# Patient Record
Sex: Female | Born: 1935 | Race: White | Hispanic: No | Smoking: Never smoker
Health system: Southern US, Community
[De-identification: ages and names within clinical notes are randomized; demographics above are authoritative.]

## PROBLEM LIST (undated history)

## (undated) DIAGNOSIS — I10 Essential (primary) hypertension: Secondary | ICD-10-CM

## (undated) DIAGNOSIS — F0390 Unspecified dementia without behavioral disturbance: Secondary | ICD-10-CM

## (undated) DIAGNOSIS — E785 Hyperlipidemia, unspecified: Secondary | ICD-10-CM

## (undated) DIAGNOSIS — H3530 Unspecified macular degeneration: Secondary | ICD-10-CM

## (undated) DIAGNOSIS — K5792 Diverticulitis of intestine, part unspecified, without perforation or abscess without bleeding: Secondary | ICD-10-CM

## (undated) DIAGNOSIS — M1990 Unspecified osteoarthritis, unspecified site: Secondary | ICD-10-CM

## (undated) HISTORY — PX: APPENDECTOMY: SHX54

## (undated) HISTORY — DX: Unspecified osteoarthritis, unspecified site: M19.90

## (undated) HISTORY — DX: Unspecified macular degeneration: H35.30

## (undated) HISTORY — DX: Hyperlipidemia, unspecified: E78.5

## (undated) HISTORY — PX: TONSILLECTOMY AND ADENOIDECTOMY: SHX28

## (undated) HISTORY — DX: Essential (primary) hypertension: I10

## (undated) HISTORY — DX: Diverticulitis of intestine, part unspecified, without perforation or abscess without bleeding: K57.92

---
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
## 2006-10-07 ENCOUNTER — Ambulatory Visit: Payer: Self-pay | Admitting: Internal Medicine

## 2006-10-11 ENCOUNTER — Ambulatory Visit: Payer: Self-pay | Admitting: Internal Medicine

## 2006-10-11 ENCOUNTER — Ambulatory Visit (HOSPITAL_COMMUNITY): Admission: RE | Admit: 2006-10-11 | Discharge: 2006-10-11 | Payer: Self-pay | Admitting: Internal Medicine

## 2006-10-11 LAB — HM COLONOSCOPY

## 2010-08-10 ENCOUNTER — Emergency Department (HOSPITAL_COMMUNITY): Admission: EM | Admit: 2010-08-10 | Discharge: 2010-08-10 | Payer: Self-pay | Admitting: Emergency Medicine

## 2010-08-11 ENCOUNTER — Ambulatory Visit: Payer: Self-pay | Admitting: Orthopedic Surgery

## 2010-08-11 DIAGNOSIS — S92309A Fracture of unspecified metatarsal bone(s), unspecified foot, initial encounter for closed fracture: Secondary | ICD-10-CM | POA: Insufficient documentation

## 2010-09-24 ENCOUNTER — Ambulatory Visit
Admission: RE | Admit: 2010-09-24 | Discharge: 2010-09-24 | Payer: Self-pay | Source: Home / Self Care | Attending: Orthopedic Surgery | Admitting: Orthopedic Surgery

## 2010-09-27 ENCOUNTER — Encounter: Payer: Self-pay | Admitting: Orthopedic Surgery

## 2010-10-13 NOTE — Letter (Signed)
Summary: History form  History form   Imported By: Jacklynn Ganong 08/11/2010 15:40:20  _____________________________________________________________________  External Attachment:    Type:   Image     Comment:   External Document

## 2010-10-13 NOTE — Assessment & Plan Note (Signed)
Summary: [facility] FX 5TH METAR/XR THERE/[facility]/BSF   Vital Signs:  Patient profile:   77 year old female Height:      64 inches Weight:      160 pounds Pulse rate:   80 / minute Resp:     16 per minute  Vitals Entered By: Fuller Canada MD (August 11, 2010 10:47 AM)  Visit Type:  new patient Referring Provider:  [facility] Primary Provider:  Dr. [name]  CC:  right foot fracture.  History of Present Illness: I saw Lynn Mcintosh in the office today for an initial visit.  She is a 77 years old woman with the complaint of:  right foot fracture.  DOI 08/09/10.  Xrays [facility] 08/10/10.  Meds: Lisinopril, no pain med.  This 77 year old female complains of mild to moderate RIGHT foot pain associated with some swelling and bruising of the RIGHT foot and difficulty with weightbearing.  She was placed in a posterior splint after x-rays show an avulsion fracture at the proximal fifth metatarsal bone she comes in for her first evaluation     Allergies (verified): No Known Drug Allergies  Past History:  Past Medical History: htn diverticulitis OA Macular degeneration  Past Surgical History: appendix tonsils  Family History: FH of Cancer:  Family History of Arthritis  Social History: Patient is married.  retired no smoking no alcohol coffee use daily 13 yr education  Review of Systems Constitutional:  Denies weight loss, weight gain, fever, chills, and fatigue. Cardiovascular:  Denies chest pain, palpitations, fainting, and murmurs. Respiratory:  Denies short of breath, wheezing, couch, tightness, pain on inspiration, and snoring . Gastrointestinal:  Denies heartburn, nausea, vomiting, diarrhea, constipation, and blood in your stools. Genitourinary:  Denies frequency, urgency, difficulty urinating, painful urination, flank pain, and bleeding in urine. Neurologic:  Denies numbness, tingling, unsteady gait, dizziness, tremors, and seizure. Musculoskeletal:  Complains of muscle  pain; denies joint pain, swelling, instability, stiffness, redness, and heat. Endocrine:  Denies excessive thirst, exessive urination, and heat or cold intolerance. Psychiatric:  Denies nervousness, depression, anxiety, and hallucinations. Skin:  Denies changes in the skin, poor healing, rash, itching, and redness. HEENT:  Denies blurred or double vision, eye pain, redness, and watering. Immunology:  Denies seasonal allergies, sinus problems, and allergic to bee stings. Hemoatologic:  Denies easy bleeding and brusing.  Physical Exam  Psych:  alert and cooperative; normal mood and affect; normal attention span and concentration   Foot/Ankle Exam  General:    Well-developed, well-nourished ,normal body habitus; no deformities, normal grooming.    Gait:    antalgic.    Skin:    redness.    Inspection:    ecchymosis: and swelling:RIGHT foot  Palpation:    proximal fifth metatarsal RIGHT foot  Vascular:    dorsalis pedis and posterior tibial pulses 2+ and symmetric, capillary refill < 2 seconds, normal hair pattern, no evidence of ischemia.   Sensory:    gross sensation intact bilaterally in lower extremities.    Motor:    Motor strength 5/5 bilaterally for ankle dorsiflexion, ankle plantar flexion, ankle inversion and ankle eversion.    Reflexes:    .deferred    Ankle Exam:    Right:    Inspection/Palpation:  normal range of motion  Foot Exam:    Right:    Inspection:  Abnormal    Palpation:  Abnormal   Impression & Recommendations:  Problem # 1:  CLOSED FRACTURE OF METATARSAL BONE (ICD-825.25) Assessment New  hospital films avulsion  fracture proximal fifth metatarsal  Orders: New Patient Level III (16109)  Patient Instructions: 1)  6 weeks wear post op shoe  2)  return for xrays    Orders Added: 1)  New Patient Level III [60454]

## 2010-10-15 NOTE — Assessment & Plan Note (Signed)
Summary: 6 wk RE-CK XRAYS RT FOOT(5th METATARSAL)FX CARE/MEDICARE/CAF   Visit Type:  Follow-up Referring Provider:  [facility] Primary Provider:  Dr. [name]  CC:  right foot fracture.  History of Present Illness: I saw Lynn Mcintosh in the office today for a followup visit.  She is a 77 years old woman with the complaint of:  right foot fracture  DOI 08/09/10.  Xrays [facility] 08/10/10.  Meds: Lisinopril, no pain med.  Today, xrays and recheck.  Complaints: none  Exam fracture is non tender   Separate and Identifiable X-Ray report      3 views right foot / dx fracture   prox 5th MTT frcature avulsion has healed in normal alignment   IMPR: healed fracture 5th MMT   A/P ok to d/c and wear reg shoes    Allergies: No Known Drug Allergies   Impression & Recommendations:  Problem # 1:  CLOSED FRACTURE OF METATARSAL BONE (ICD-825.25) Assessment Improved  Orders: Post-Op Check (40981) Foot x-ray complete, minimum 3 views (19147)  Patient Instructions: 1)  Please schedule a follow-up appointment as needed.   Orders Added: 1)  Post-Op Check [99024] 2)  Foot x-ray complete, minimum 3 views [73630]

## 2010-10-28 ENCOUNTER — Encounter: Payer: Self-pay | Admitting: Orthopedic Surgery

## 2010-10-28 ENCOUNTER — Ambulatory Visit (INDEPENDENT_AMBULATORY_CARE_PROVIDER_SITE_OTHER): Payer: Medicare Other | Admitting: Orthopedic Surgery

## 2010-10-28 DIAGNOSIS — S93409A Sprain of unspecified ligament of unspecified ankle, initial encounter: Secondary | ICD-10-CM

## 2010-10-28 DIAGNOSIS — S8263XA Displaced fracture of lateral malleolus of unspecified fibula, initial encounter for closed fracture: Secondary | ICD-10-CM | POA: Insufficient documentation

## 2010-11-04 NOTE — Assessment & Plan Note (Signed)
Summary: new problem left foot injury needs xr/aarp/bsf   Visit Type:  new problem Referring Provider:  [facility] Primary Provider:  Dr. Janna Mcintosh  CC:  left ankle pain .  History of Present Illness: I saw Lynn Mcintosh in the office today for an initial visit.  She is a 77 years old woman with the complaint of: left ankle pain   Meds: Lisinopril, no pain med.  DOI 10-24-10. Fall  c/o lateral ankle pain and pain with weight bearing; swelling, ecchymosis,   Xrays today.  Allergies: No Known Drug Allergies  Review of Systems Constitutional:  Denies weight loss, weight gain, fever, chills, and fatigue. Cardiovascular:  Denies chest pain, palpitations, fainting, and murmurs. Respiratory:  Denies short of breath, wheezing, couch, tightness, pain on inspiration, and snoring . Gastrointestinal:  Denies heartburn, nausea, vomiting, diarrhea, constipation, and blood in your stools. Genitourinary:  Denies frequency, urgency, difficulty urinating, painful urination, flank pain, and bleeding in urine. Neurologic:  Denies numbness, tingling, unsteady gait, dizziness, tremors, and seizure. Musculoskeletal:  Denies joint pain, swelling, instability, stiffness, redness, heat, and muscle pain. Endocrine:  Denies excessive thirst, exessive urination, and heat or cold intolerance. Psychiatric:  Denies nervousness, depression, anxiety, and hallucinations. Skin:  Denies changes in the skin, poor healing, rash, itching, and redness. HEENT:  Denies blurred or double vision, eye pain, redness, and watering. Immunology:  Denies seasonal allergies, sinus problems, and allergic to bee stings. Hemoatologic:  Denies easy bleeding and brusing.  Physical Exam  Additional Exam:  General appearance is normal.  she is oriented x3.  Her mood and affect are normal.  She is ambulatory with, a he'll bearing date  Tenderness and swelling, distal fibula and syndesmosis. Range of motion zero-20, LEFT ankle. LEFT  ankle stable. Plantarflexion strength is 5 over 5.  Skin his bruise and ecchymosis over the syndesmosis. Pulse and temperature are normal. Sensation normal. Balance is good.     Impression & Recommendations:  Problem # 1:  CLOSED FRACTURE OF LATERAL MALLEOLUS (ICD-824.2) Assessment New  Separate and Identifiable X-Ray report       3 views, ankle, LEFT lower extremity  The distal fibula nondisplaced fracture, syndesmosis intact mortise intact.  Impression distal fibula fracture  Orders: Est. Patient Level IV (16109) Ankle x-ray complete,  minimum 3 views (60454)  Patient Instructions: 1)  wear brace for walking  2)  recheck ankle in 1 month with left ankle xrays    Orders Added: 1)  Est. Patient Level IV [09811] 2)  Ankle x-ray complete,  minimum 3 views [91478]

## 2010-11-12 ENCOUNTER — Encounter: Payer: Self-pay | Admitting: Orthopedic Surgery

## 2010-11-30 ENCOUNTER — Encounter: Payer: Self-pay | Admitting: Orthopedic Surgery

## 2010-12-01 ENCOUNTER — Ambulatory Visit (INDEPENDENT_AMBULATORY_CARE_PROVIDER_SITE_OTHER): Payer: Medicare Other | Admitting: Orthopedic Surgery

## 2010-12-01 DIAGNOSIS — S8263XA Displaced fracture of lateral malleolus of unspecified fibula, initial encounter for closed fracture: Secondary | ICD-10-CM

## 2010-12-01 NOTE — Discharge Summary (Signed)
3 views, LEFT ankle. for LEFT ankle fracture, lateral malleolus.  The previously noted lateral malleolar fracture Weber type A, has healed.  Mortise is intact.  Impression healed LEFT ankle fracture

## 2010-12-01 NOTE — Patient Instructions (Signed)
Activities as tolerated follow as needed

## 2010-12-01 NOTE — Progress Notes (Signed)
   LEFT ankle exam.  Patient is ambulating without any assistive device. There is no tenderness at the fracture site. She has normal ankle range of motion.

## 2010-12-10 NOTE — Letter (Signed)
Summary: History form  History form   Imported By: Cammie Sickle 11/29/2010 12:24:47  _____________________________________________________________________  External Attachment:    Type:   Image     Comment:   External Document

## 2011-01-29 NOTE — Consult Note (Signed)
NAMEMarland Kitchen  TWANNA, RESH NO.:  [id number]   MEDICAL RECORD NO.:  [id number]           PATIENT TYPE:   LOCATION:                                 FACILITY:   PHYSICIAN:  Lionel December, M.D.    DATE OF BIRTH:  Jun 05, 1921   DATE OF CONSULTATION:  DATE OF DISCHARGE:                                 CONSULTATION   REQUESTING PHYSICIAN:  Melvyn Novas, MD   REASON FOR CONSULTATION:  Time for colonoscopy, constipation.   HISTORY OF PRESENT ILLNESS:  Ms. Messamore is an 77 year old lady who  presents for worsening of constipation.  She states that she is overdue  for a colonoscopy.  Last colonoscopy was in September, 2001, at which  time she had a left colonic diverticulosis and a tiny polyp at the  splenic flexure which was hyperplastic.  She has a son who was diagnosed  with colon cancer at age 67 and is nine years out with multiple  complications.  Because of this, she is on a five-year follow-up  schedule.  She has had chronic constipation in the last 1-2 months.  The  symptoms have been worsened.  She states that it may be due to combined  calcium and pravastatin that she has been on.  She never has a bowel  movement now unless she takes a laxative.  She has been taking Senalax  every 3-4 days with good results.  She denies any melena or rectal  bleeding.  She really has no abdominal pain, nausea, vomiting, or  heartburn.  Denies any weight loss.   CURRENT MEDICATIONS:  1. Fosamax 70 mg weekly.  2. Micardis 40 mg daily.  3. Naproxen 500 mg b.i.d.  4. Fish oil tablets t.i.d.  5. Icap t.i.d.  6. Senalax every 3-4 days.  7. Pravastatin 40 mg daily.   ALLERGIES:  No known drug allergies.   PAST MEDICAL HISTORY:  Osteoporosis, osteoarthritis,  hypercholesterolemia, hypertension.   PAST SURGICAL HISTORY:  Appendectomy, tonsillectomy, right breast biopsy  in 1980, which was benign.   FAMILY HISTORY:  She had a son diagnosed with colon cancer at age 52,  who underwent seven surgeries, radiation and chemotherapy.   SOCIAL HISTORY:  She has been married for 65 years, retired Product/process development scientist, nonsmoker.  No alcohol use.   REVIEW OF SYSTEMS:  See HPI for GI.  CARDIOPULMONARY:  No chest pain or  shortness of breath.  See HPI for constitutional.   PHYSICAL EXAMINATION:  VITAL SIGNS:  Weight 188.  Height 5 feet 2.  Temp  97.4, blood pressure 126/82, pulse 84.  GENERAL:  A pleasant elderly Caucasian female in no acute distress.  SKIN:  Warm and dry.  No jaundice.  HEENT:  Sclerae are anicteric.  Oropharyngeal mucosa is moist and pink.  No lesions, erythema, or exudate.  NECK:  No lymphadenopathy.  CHEST:  Lungs are clear to auscultation.  CARDIAC:  Regular rate and rhythm.  A normal S1 and S2.  No murmurs,  rubs or gallops.  ABDOMEN:  Positive bowel sounds.  Abdomen is soft, nontender  and  nondistended.  No organomegaly or masses.  EXTREMITIES:  No edema.   IMPRESSION:  Ms. Hasten is an 77 year old lady with worsening  constipation, possibly related to medications.  Her last colonoscopy was  in September, 2001.  She has a family history of colorectal cancer in a  son.  She is overdue for surveillance study at this time.   PLAN:  1. Colonoscopy with Dr. Karilyn Cota in the near future.  2. MiraLax 17 gm daily, two weeks of samples provided.  3. Further recommendations to follow.      Tana Coast, P.A.      Lionel December, M.D.  Electronically Signed    LL/MEDQ  D:  10/07/2006  T:  10/07/2006  Job:  [id number]   cc:   Melvyn Novas, MD  Fax: [phone]

## 2011-01-29 NOTE — Op Note (Signed)
Lynn Mcintosh                 ACCOUNT NO.:  [id number]   MEDICAL RECORD NO.:  [id number]          PATIENT TYPE:  AMB   LOCATION:  DAY                           FACILITY:  [facility]   PHYSICIAN:  Lionel December, M.D.    DATE OF BIRTH:  1920-11-28   DATE OF PROCEDURE:  10/11/2006  DATE OF DISCHARGE:                               OPERATIVE REPORT   PROCEDURE:  Colonoscopy.   INDICATIONS:  Lynn Mcintosh is an 77 year old Caucasian female who is undergoing  colonoscopy both for diagnostic and screening purposes.  She has chronic  constipation which has gotten worse lately.  Her last exam was in  September 2001, with nonedematous polyp.  Family history is significant  colon for colon carcinoma,  son who had surgery at age 24.  The  procedure and risks were reviewed the patient. Informed consent was  obtained.   MEDICATIONS FOR CONSCIOUS SEDATION:  Demerol 15 mg IV, Versed 3 mg IV.   FINDINGS:  Procedure performed in endoscopy suite.  The patient's vital  signs and O2 saturation were monitored during the procedure and remained  stable.  The patient was placed in the left lateral recumbent position  and rectal examination performed.  No abnormality noted on external or  digital exam.  Pentax videoscope was placed rectum and advanced under  vision into sigmoid colon and beyond.  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  She had multiple diverticula in the  sigmoid colon, and some of which were quite large.  The scope was  carefully minimal maneuvered in this segment and passed into the  descending colon, where she had a few more scattered diverticula.  Preparation was satisfactory.  Scope was advanced into cecum which was  identified by the ileocecal valve and appendiceal orifice.  She had to  be turned on the right side in order to see behind the valve.  As the  scope was withdrawn, colonic mucosa was once again carefully examined,  and there were no polyps and/or tumor  masses.  Similarly, no stricture  was noted in the sigmoid colon.  Rectal mucosa was normal.  Scope was  retroflexed to examine anorectal junction, and small hemorrhoids were  noted below the dentate line.  Endoscope was straightened and withdrawn.  The patient tolerated the procedure well.   FINAL DIAGNOSIS:  1. Left colonic diverticulosis.  She has a few diverticula at the      descending colon but multiple at sigmoid colon.  2. Next small external hemorrhoids.   RECOMMENDATIONS:  She will continue a high-fiber diet and fiber  supplement 3-4 g daily, and continue MiraLax 17 g daily.  She can buy it  over the counter.   The patient also was informed of potential side effects with naproxen,  and she just needs to be careful.      Lionel December, M.D.  Electronically Signed     NR/MEDQ  D:  10/11/2006  T:  10/11/2006  Job:  [id number]   cc:   Melvyn Novas, MD  Fax: [phone]

## 2013-11-08 ENCOUNTER — Emergency Department (HOSPITAL_COMMUNITY): Payer: Medicare Other

## 2013-11-08 ENCOUNTER — Emergency Department (HOSPITAL_COMMUNITY)
Admission: EM | Admit: 2013-11-08 | Discharge: 2013-11-08 | Disposition: A | Discharge status: Home / Self Care | Payer: Medicare Other | Attending: Emergency Medicine | Admitting: Emergency Medicine

## 2013-11-08 ENCOUNTER — Encounter (HOSPITAL_COMMUNITY): Payer: Self-pay | Admitting: Emergency Medicine

## 2013-11-08 DIAGNOSIS — S1093XA Contusion of unspecified part of neck, initial encounter: Secondary | ICD-10-CM

## 2013-11-08 DIAGNOSIS — Z8719 Personal history of other diseases of the digestive system: Secondary | ICD-10-CM | POA: Insufficient documentation

## 2013-11-08 DIAGNOSIS — S93409A Sprain of unspecified ligament of unspecified ankle, initial encounter: Secondary | ICD-10-CM | POA: Insufficient documentation

## 2013-11-08 DIAGNOSIS — S93401A Sprain of unspecified ligament of right ankle, initial encounter: Secondary | ICD-10-CM

## 2013-11-08 DIAGNOSIS — X500XXA Overexertion from strenuous movement or load, initial encounter: Secondary | ICD-10-CM | POA: Insufficient documentation

## 2013-11-08 DIAGNOSIS — S0003XA Contusion of scalp, initial encounter: Secondary | ICD-10-CM | POA: Insufficient documentation

## 2013-11-08 DIAGNOSIS — Y9301 Activity, walking, marching and hiking: Secondary | ICD-10-CM | POA: Insufficient documentation

## 2013-11-08 DIAGNOSIS — Z79899 Other long term (current) drug therapy: Secondary | ICD-10-CM | POA: Insufficient documentation

## 2013-11-08 DIAGNOSIS — Z8669 Personal history of other diseases of the nervous system and sense organs: Secondary | ICD-10-CM | POA: Insufficient documentation

## 2013-11-08 DIAGNOSIS — Y929 Unspecified place or not applicable: Secondary | ICD-10-CM | POA: Insufficient documentation

## 2013-11-08 DIAGNOSIS — IMO0002 Reserved for concepts with insufficient information to code with codable children: Secondary | ICD-10-CM | POA: Insufficient documentation

## 2013-11-08 DIAGNOSIS — Z8739 Personal history of other diseases of the musculoskeletal system and connective tissue: Secondary | ICD-10-CM | POA: Insufficient documentation

## 2013-11-08 DIAGNOSIS — W07XXXA Fall from chair, initial encounter: Secondary | ICD-10-CM | POA: Insufficient documentation

## 2013-11-08 DIAGNOSIS — S0100XA Unspecified open wound of scalp, initial encounter: Secondary | ICD-10-CM | POA: Insufficient documentation

## 2013-11-08 DIAGNOSIS — W19XXXA Unspecified fall, initial encounter: Secondary | ICD-10-CM

## 2013-11-08 DIAGNOSIS — S0990XA Unspecified injury of head, initial encounter: Secondary | ICD-10-CM

## 2013-11-08 DIAGNOSIS — I10 Essential (primary) hypertension: Secondary | ICD-10-CM | POA: Insufficient documentation

## 2013-11-08 DIAGNOSIS — S0083XA Contusion of other part of head, initial encounter: Secondary | ICD-10-CM

## 2013-11-08 NOTE — ED Notes (Signed)
Pt presents with small laceration to forehead as well as right ankle swelling and pain. Pt states her ankle rolled over while she was walking causing her to fall. Pt denies LOC. A&Ox4. Bleeding controlled pta.Pt's last tetanus shot was over 5 years ago.

## 2013-11-08 NOTE — ED Notes (Signed)
Ice applied to forehead.

## 2013-11-08 NOTE — ED Provider Notes (Signed)
CSN: [id number]     Arrival date & time 11/08/13  1727 History  This chart was scribed for Lynn Hutching, MD by Ardelia Mems, ED Scribe. This patient was seen in room [facility]03/[facility]03 and the patient's care was started at 7:56 PM.   Chief Complaint  Patient presents with  . Fall    The history is provided by the patient. No language interpreter was used.    HPI Comments: Lynn Mcintosh is a 80 y.o. female who presents to the Emergency Department complaining of a fall that occurred earlier tonight. Pt states that she was getting out of a chair, and that her foot was asleep, causing her to fall. She states that she rolled her right ankle at the time of the fall, and she is complaining of constant, moderate right ankle pain with associated swelling onset after the fall. She is also complaining of bruising to her forehead and a small laceration to her forehead, as well as mild neck pain described as "soreness". She denies LOC pertaining to the fall. She denies hip pain, or any other pain or symptoms.   Past Medical History  Diagnosis Date  . OA (osteoarthritis)   . Hypertension   . Diverticulitis   . Macular degeneration    Past Surgical History  Procedure Laterality Date  . Appendectomy    . Tonsillectomy and adenoidectomy     Family History  Problem Relation Age of Onset  . Cancer      family history   . Arthritis      family history    History  Substance Use Topics  . Smoking status: Never Smoker   . Smokeless tobacco: Not on file  . Alcohol Use: No   OB History   Grav Para Term Preterm Abortions TAB SAB Ect Mult Living                 Review of Systems  Musculoskeletal: Positive for arthralgias (right ankle), joint swelling (right ankle) and neck pain.  Skin: Positive for wound (forehead laceration).  Neurological: Negative for syncope.  All other systems reviewed and are negative.   Allergies  Review of patient's allergies indicates no known allergies.  Home Medications    Current Outpatient Rx  Name  Route  Sig  Dispense  Refill  . LISINOPRIL PO   Oral   Take 1 tablet by mouth daily.          . Psyllium (METAMUCIL PO)   Oral   Take 1 tablet by mouth 2 (two) times daily.          Triage Vitals: BP 106/80  Pulse 93  Temp(Src) 97.9 F (36.6 C) (Oral)  Resp 18  SpO2 100%  Physical Exam  Nursing note and vitals reviewed. Constitutional: She is oriented to person, place, and time. She appears well-developed and well-nourished.  HENT:  Head: Normocephalic.  Central and lower forehead ecchymosis. 1 cm eliptical laceration on right forehead.  Eyes: Conjunctivae and EOM are normal. Pupils are equal, round, and reactive to light.  Neck: Normal range of motion. Neck supple.  Cardiovascular: Normal rate, regular rhythm and normal heart sounds.   Pulmonary/Chest: Effort normal and breath sounds normal.  Abdominal: Soft. Bowel sounds are normal.  Musculoskeletal: Normal range of motion. She exhibits tenderness.  Mild tenderness in upper thoracic spine. Tenderness in lateral aspect of right ankle.  Neurological: She is alert and oriented to person, place, and time.  Skin: Skin is warm and dry.  Psychiatric:  She has a normal mood and affect. Her behavior is normal.    ED Course  Procedures (including critical care time)  DIAGNOSTIC STUDIES: Oxygen Saturation is 100% on RA, normal by my interpretation.    COORDINATION OF CARE: 8:01 PM- Discussed plan to obtain diagnostic radiology of pt's head, neck and right ankle/foot. Will also apply ice to pt's forehead. Discussed that sutures/staples are not needed for pt's small forehead laceration. Pt advised of plan for treatment and pt agrees.  Labs Review Labs Reviewed - No data to display Imaging Review Dg Ankle Complete Right  11/08/2013   CLINICAL DATA:  Injury right ankle 11/08/2013.  EXAM: RIGHT ANKLE - COMPLETE 3+ VIEW  COMPARISON:  Plain films the right foot 08/10/2010.  FINDINGS: No acute bony  or joint abnormality is identified. Plantar calcaneal spur is noted. Atherosclerotic vascular disease is seen.  IMPRESSION: No acute finding.   Electronically Signed   By: Drusilla Kannerhomas  Dalessio M.D.   On: 11/08/2013 20:51   Ct Head Wo Contrast  11/08/2013   CLINICAL DATA:  Fall, abrasion with forehead swelling. Headache and neck pain.  EXAM: CT HEAD WITHOUT CONTRAST  CT MAXILLOFACIAL WITHOUT CONTRAST  CT CERVICAL SPINE WITHOUT CONTRAST  TECHNIQUE: Multidetector CT imaging of the head, cervical spine, and maxillofacial structures were performed using the standard protocol without intravenous contrast. Multiplanar CT image reconstructions of the cervical spine and maxillofacial structures were also generated.  COMPARISON:  None available for comparison at time of study interpretation.  FINDINGS: CT HEAD FINDINGS  The ventricles and sulci are normal for age. No intraparenchymal hemorrhage, mass effect nor midline shift. Patchy supratentorial white matter hypodensities are within normal range for patient's age and though non-specific suggest sequelae of chronic small vessel ischemic disease. No acute large vascular territory infarcts.  No abnormal extra-axial fluid collections. Basal cisterns are patent. Moderate calcific atherosclerosis of the carotid siphons.  No skull fracture. Small right frontal scalp defect may reflect laceration without radiopaque foreign bodies. Ocular globes and orbital contents are nonsuspicious, status post bilateral ocular lens implants.  CT MAXILLOFACIAL FINDINGS  No facial fracture. The mandible is intact in the condyles are located.Severe left temporomandibular osteoarthrosis, moderate on the right. Air-fluid level in the left maxillary sinus associated with bony wall thickening consistent with chronic sinusitis. Nasal septum slightly deviated to the right. No destructive bony lesions.  Ocular globes and orbital contents are nonsuspicious, status post bilateral ocular lens implants. Torus  palatini. Left submandibular 10 mm sialolith with atrophic left submandibular gland.  CT CERVICAL SPINE FINDINGS  Cervical vertebral bodies and posterior elements appear intact and aligned with maintenance of the cervical lordosis. Severe C4-5, C5-6 degenerative disc disease, moderate to severe at C6-7. C1-2 articulation maintained with moderate arthropathy and calcified apical ligament. No destructive bony lesions. Coarse calcification of the carotid bifurcations. Left submandibular 10 mm sialolith with atrophic left submandibular gland. Paraspinal soft tissues are unremarkable.  Degenerative disc disease and facet arthropathy result in mild canal stenosis C3-4, mild to moderate C4-5, moderate C5-6. Severe left C3-4, bilateral C4-5, C5-6 neural foraminal narrowing.  IMPRESSION: CT head: Suspected small right frontal scalp laceration without underlying skull fracture and no acute intracranial process.  Involutional changes. Moderate white matter changes suggest chronic small vessel ischemic disease.  Maxillofacial CT: No facial fracture. Acute on chronic left maxillary sinusitis.  CT cervical spine: No acute cervical spine fracture nor malalignment.  Left submandibular gland atrophy associated with 10 mm sialolith, no CT findings of acute sialoadenitis.  Electronically Signed   By: Awilda Metro   On: 11/08/2013 20:57   Ct Cervical Spine Wo Contrast  11/08/2013   CLINICAL DATA:  Fall, abrasion with forehead swelling. Headache and neck pain.  EXAM: CT HEAD WITHOUT CONTRAST  CT MAXILLOFACIAL WITHOUT CONTRAST  CT CERVICAL SPINE WITHOUT CONTRAST  TECHNIQUE: Multidetector CT imaging of the head, cervical spine, and maxillofacial structures were performed using the standard protocol without intravenous contrast. Multiplanar CT image reconstructions of the cervical spine and maxillofacial structures were also generated.  COMPARISON:  None available for comparison at time of study interpretation.  FINDINGS: CT HEAD  FINDINGS  The ventricles and sulci are normal for age. No intraparenchymal hemorrhage, mass effect nor midline shift. Patchy supratentorial white matter hypodensities are within normal range for patient's age and though non-specific suggest sequelae of chronic small vessel ischemic disease. No acute large vascular territory infarcts.  No abnormal extra-axial fluid collections. Basal cisterns are patent. Moderate calcific atherosclerosis of the carotid siphons.  No skull fracture. Small right frontal scalp defect may reflect laceration without radiopaque foreign bodies. Ocular globes and orbital contents are nonsuspicious, status post bilateral ocular lens implants.  CT MAXILLOFACIAL FINDINGS  No facial fracture. The mandible is intact in the condyles are located.Severe left temporomandibular osteoarthrosis, moderate on the right. Air-fluid level in the left maxillary sinus associated with bony wall thickening consistent with chronic sinusitis. Nasal septum slightly deviated to the right. No destructive bony lesions.  Ocular globes and orbital contents are nonsuspicious, status post bilateral ocular lens implants. Torus palatini. Left submandibular 10 mm sialolith with atrophic left submandibular gland.  CT CERVICAL SPINE FINDINGS  Cervical vertebral bodies and posterior elements appear intact and aligned with maintenance of the cervical lordosis. Severe C4-5, C5-6 degenerative disc disease, moderate to severe at C6-7. C1-2 articulation maintained with moderate arthropathy and calcified apical ligament. No destructive bony lesions. Coarse calcification of the carotid bifurcations. Left submandibular 10 mm sialolith with atrophic left submandibular gland. Paraspinal soft tissues are unremarkable.  Degenerative disc disease and facet arthropathy result in mild canal stenosis C3-4, mild to moderate C4-5, moderate C5-6. Severe left C3-4, bilateral C4-5, C5-6 neural foraminal narrowing.  IMPRESSION: CT head: Suspected  small right frontal scalp laceration without underlying skull fracture and no acute intracranial process.  Involutional changes. Moderate white matter changes suggest chronic small vessel ischemic disease.  Maxillofacial CT: No facial fracture. Acute on chronic left maxillary sinusitis.  CT cervical spine: No acute cervical spine fracture nor malalignment.  Left submandibular gland atrophy associated with 10 mm sialolith, no CT findings of acute sialoadenitis.   Electronically Signed   By: Awilda Metro   On: 11/08/2013 20:57   Dg Foot Complete Right  11/08/2013   CLINICAL DATA:  Injury, right foot pain.  EXAM: RIGHT FOOT COMPLETE - 3+ VIEW  COMPARISON:  Plain films right foot 08/10/2010.  FINDINGS: No acute bony or joint abnormality is identified. Advanced first MTP osteoarthritis is seen. Fracture at the base of the fifth metatarsal seen on the comparison study has healed.  IMPRESSION: No acute finding.  Advanced first MTP osteoarthritis.   Electronically Signed   By: Drusilla Kanner M.D.   On: 11/08/2013 20:51   Ct Maxillofacial Wo Cm  11/08/2013   CLINICAL DATA:  Fall, abrasion with forehead swelling. Headache and neck pain.  EXAM: CT HEAD WITHOUT CONTRAST  CT MAXILLOFACIAL WITHOUT CONTRAST  CT CERVICAL SPINE WITHOUT CONTRAST  TECHNIQUE: Multidetector CT imaging of the head, cervical spine, and maxillofacial  structures were performed using the standard protocol without intravenous contrast. Multiplanar CT image reconstructions of the cervical spine and maxillofacial structures were also generated.  COMPARISON:  None available for comparison at time of study interpretation.  FINDINGS: CT HEAD FINDINGS  The ventricles and sulci are normal for age. No intraparenchymal hemorrhage, mass effect nor midline shift. Patchy supratentorial white matter hypodensities are within normal range for patient's age and though non-specific suggest sequelae of chronic small vessel ischemic disease. No acute large vascular  territory infarcts.  No abnormal extra-axial fluid collections. Basal cisterns are patent. Moderate calcific atherosclerosis of the carotid siphons.  No skull fracture. Small right frontal scalp defect may reflect laceration without radiopaque foreign bodies. Ocular globes and orbital contents are nonsuspicious, status post bilateral ocular lens implants.  CT MAXILLOFACIAL FINDINGS  No facial fracture. The mandible is intact in the condyles are located.Severe left temporomandibular osteoarthrosis, moderate on the right. Air-fluid level in the left maxillary sinus associated with bony wall thickening consistent with chronic sinusitis. Nasal septum slightly deviated to the right. No destructive bony lesions.  Ocular globes and orbital contents are nonsuspicious, status post bilateral ocular lens implants. Torus palatini. Left submandibular 10 mm sialolith with atrophic left submandibular gland.  CT CERVICAL SPINE FINDINGS  Cervical vertebral bodies and posterior elements appear intact and aligned with maintenance of the cervical lordosis. Severe C4-5, C5-6 degenerative disc disease, moderate to severe at C6-7. C1-2 articulation maintained with moderate arthropathy and calcified apical ligament. No destructive bony lesions. Coarse calcification of the carotid bifurcations. Left submandibular 10 mm sialolith with atrophic left submandibular gland. Paraspinal soft tissues are unremarkable.  Degenerative disc disease and facet arthropathy result in mild canal stenosis C3-4, mild to moderate C4-5, moderate C5-6. Severe left C3-4, bilateral C4-5, C5-6 neural foraminal narrowing.  IMPRESSION: CT head: Suspected small right frontal scalp laceration without underlying skull fracture and no acute intracranial process.  Involutional changes. Moderate white matter changes suggest chronic small vessel ischemic disease.  Maxillofacial CT: No facial fracture. Acute on chronic left maxillary sinusitis.  CT cervical spine: No acute  cervical spine fracture nor malalignment.  Left submandibular gland atrophy associated with 10 mm sialolith, no CT findings of acute sialoadenitis.   Electronically Signed   By: Awilda Metro   On: 11/08/2013 20:57    EKG Interpretation   None       MDM   Final diagnoses:  Fall  Head injury  Right ankle sprain    Patient is neurologically intact. CT scan of head, face, cervical spine all negative. Additionally plain films of right ankle and foot were negative. Ankle brace, ice, Tylenol.   I personally performed the services described in this documentation, which was scribed in my presence. The recorded information has been reviewed and is accurate.    Lynn Hutching, MD 11/08/13 2156

## 2013-11-08 NOTE — Discharge Instructions (Signed)
All x-rays were normal. Ankle brace.  Ice.   Tylenol for pain

## 2015-10-24 ENCOUNTER — Ambulatory Visit: Payer: Self-pay | Admitting: Internal Medicine

## 2015-11-24 ENCOUNTER — Encounter: Payer: Self-pay | Admitting: Internal Medicine

## 2015-12-19 ENCOUNTER — Ambulatory Visit: Payer: Self-pay | Admitting: Internal Medicine

## 2016-04-16 ENCOUNTER — Other Ambulatory Visit: Payer: Self-pay | Admitting: Internal Medicine

## 2016-04-16 ENCOUNTER — Ambulatory Visit (INDEPENDENT_AMBULATORY_CARE_PROVIDER_SITE_OTHER): Payer: Medicare Other | Admitting: Internal Medicine

## 2016-04-16 ENCOUNTER — Encounter: Payer: Self-pay | Admitting: Internal Medicine

## 2016-04-16 VITALS — BP 118/78 | HR 84 | Temp 97.4°F | Ht 64.0 in | Wt 166.8 lb

## 2016-04-16 DIAGNOSIS — F0390 Unspecified dementia without behavioral disturbance: Secondary | ICD-10-CM | POA: Insufficient documentation

## 2016-04-16 DIAGNOSIS — H3530 Unspecified macular degeneration: Secondary | ICD-10-CM | POA: Insufficient documentation

## 2016-04-16 HISTORY — DX: Unspecified dementia, unspecified severity, without behavioral disturbance, psychotic disturbance, mood disturbance, and anxiety: F03.90

## 2016-04-16 LAB — CBC WITH DIFFERENTIAL/PLATELET
BASOS ABS: 66 {cells}/uL (ref 0–200)
Basophils Relative: 1 %
EOS ABS: 66 {cells}/uL (ref 15–500)
Eosinophils Relative: 1 %
HCT: 39.6 % (ref 35.0–45.0)
Hemoglobin: 13.1 g/dL (ref 11.7–15.5)
LYMPHS PCT: 21 %
Lymphs Abs: 1386 cells/uL (ref 850–3900)
MCH: 31.6 pg (ref 27.0–33.0)
MCHC: 33.1 g/dL (ref 32.0–36.0)
MCV: 95.4 fL (ref 80.0–100.0)
MONOS PCT: 11 %
MPV: 10.2 fL (ref 7.5–12.5)
Monocytes Absolute: 726 cells/uL (ref 200–950)
NEUTROS PCT: 66 %
Neutro Abs: 4356 cells/uL (ref 1500–7800)
PLATELETS: 258 10*3/uL (ref 140–400)
RBC: 4.15 MIL/uL (ref 3.80–5.10)
RDW: 14.3 % (ref 11.0–15.0)
WBC: 6.6 10*3/uL (ref 3.8–10.8)

## 2016-04-16 LAB — COMPLETE METABOLIC PANEL WITH GFR
ALT: 9 U/L (ref 6–29)
AST: 20 U/L (ref 10–35)
Albumin: 4.2 g/dL (ref 3.6–5.1)
Alkaline Phosphatase: 54 U/L (ref 33–130)
BUN: 24 mg/dL (ref 7–25)
CALCIUM: 9.1 mg/dL (ref 8.6–10.4)
CHLORIDE: 104 mmol/L (ref 98–110)
CO2: 19 mmol/L — AB (ref 20–31)
CREATININE: 1.66 mg/dL — AB (ref 0.60–0.88)
GFR, EST AFRICAN AMERICAN: 30 mL/min — AB (ref >=60)
GFR, Est Non African American: 26 mL/min — ABNORMAL LOW (ref >=60)
Glucose, Bld: 96 mg/dL (ref 65–99)
POTASSIUM: 3.9 mmol/L (ref 3.5–5.3)
Sodium: 141 mmol/L (ref 135–146)
Total Bilirubin: 0.5 mg/dL (ref 0.2–1.2)
Total Protein: 6.9 g/dL (ref 6.1–8.1)

## 2016-04-16 LAB — TSH: TSH: 4.69 m[IU]/L — AB

## 2016-04-16 MED ORDER — RIVASTIGMINE 4.6 MG/24HR TD PT24: 4.6000 mg | MEDICATED_PATCH | Freq: Every day | TRANSDERMAL | 6 refills | Status: DC

## 2016-04-16 NOTE — Patient Instructions (Addendum)
Follow up in 1-2 mos for CPE/ECG  Will call with lab results   Discussed memory loss   Dementia Dementia is a general term for problems with brain function. A person with dementia has memory loss and a hard time with at least one other brain function such as thinking, speaking, or problem solving. Dementia can affect social functioning, how you do your job, your mood, or your personality. The changes may be hidden for a long time. The earliest forms of this disease are usually not detected by family or friends. Dementia can be:  Irreversible.  Potentially reversible.  Partially reversible.  Progressive. This means it can get worse over time. CAUSES  Irreversible dementia causes may include:  Degeneration of brain cells (Alzheimer disease or Lewy body dementia).  Multiple small strokes (vascular dementia).  Infection (chronic meningitis or Creutzfeldt-Jakob disease).  Frontotemporal dementia. This affects younger people, age 46 to 53, compared to those who have Alzheimer disease.  Dementia associated with other disorders like Parkinson disease, Huntington disease, or HIV-associated dementia. Potentially or partially reversible dementia causes may include:  Medicines.  Metabolic causes such as excessive alcohol intake, vitamin B12 deficiency, or thyroid disease.  Masses or pressure in the brain such as a tumor, blood clot, or hydrocephalus. SIGNS AND SYMPTOMS  Symptoms are often hard to detect. Family members or coworkers may not notice them early in the disease process. Different people with dementia may have different symptoms. Symptoms can include:  A hard time with memory, especially recent memory. Long-term memory may not be impaired.  Asking the same question multiple times or forgetting something someone just said.  A hard time speaking your thoughts or finding certain words.  A hard time solving problems or performing familiar tasks (such as how to use a  telephone).  Sudden changes in mood.  Changes in personality, especially increasing moodiness or mistrust.  Depression.  A hard time understanding complex ideas that were never a problem in the past. DIAGNOSIS  There are no specific tests for dementia.   Your health care provider may recommend a thorough evaluation. This is because some forms of dementia can be reversible. The evaluation will likely include a physical exam and getting a detailed history from you and a family member. The history often gives the best clues and suggestions for a diagnosis.  Memory testing may be done. A detailed brain function evaluation called neuropsychologic testing may be helpful.  Lab tests and brain imaging (such as a CT scan or MRI scan) are sometimes important.  Sometimes observation and re-evaluation over time is very helpful. TREATMENT  Treatment depends on the cause.   If the problem is a vitamin deficiency, it may be helped or cured with supplements.  For dementias such as Alzheimer disease, medicines are available to stabilize or slow the course of the disease. There are no cures for this type of dementia.  Your health care provider can help direct you to groups, organizations, and other health care providers to help with decisions in the care of you or your loved one. HOME CARE INSTRUCTIONS The care of individuals with dementia is varied and dependent upon the progression of the dementia. The following suggestions are intended for the person living with, or caring for, the person with dementia.  Create a safe environment.  Remove the locks on bathroom doors to prevent the person from accidentally locking himself or herself in.  Use childproof latches on kitchen cabinets and any place where cleaning supplies, chemicals, or  alcohol are kept.  Use childproof covers in unused electrical outlets.  Install childproof devices to keep doors and windows secured.  Remove stove knobs or  install safety knobs and an automatic shut-off on the stove.  Lower the temperature on water heaters.  Label medicines and keep them locked up.  Secure knives, lighters, matches, power tools, and guns, and keep these items out of reach.  Keep the house free from clutter. Remove rugs or anything that might contribute to a fall.  Remove objects that might break and hurt the person.  Make sure lighting is good, both inside and outside.  Install grab rails as needed.  Use a monitoring device to alert you to falls or other needs for help.  Reduce confusion.  Keep familiar objects and people around.  Use night lights or dim lights at night.  Label items or areas.  Use reminders, notes, or directions for daily activities or tasks.  Keep a simple, consistent routine for waking, meals, bathing, dressing, and bedtime.  Create a calm, quiet environment.  Place large clocks and calendars prominently.  Display emergency numbers and home address near all telephones.  Use cues to establish different times of the day. An example is to open curtains to let the natural light in during the day.   Use effective communication.  Choose simple words and short sentences.  Use a gentle, calm tone of voice.  Be careful not to interrupt.  If the person is struggling to find a word or communicate a thought, try to provide the word or thought.  Ask one question at a time. Allow the person ample time to answer questions. Repeat the question again if the person does not respond.  Reduce nighttime restlessness.  Provide a comfortable bed.  Have a consistent nighttime routine.  Ensure a regular walking or physical activity schedule. Involve the person in daily activities as much as possible.  Limit napping during the day.  Limit caffeine.  Attend social events that stimulate rather than overwhelm the senses.  Encourage good nutrition and hydration.  Reduce distractions during meal  times and snacks.  Avoid foods that are too hot or too cold.  Monitor chewing and swallowing ability.  Continue with routine vision, hearing, dental, and medical screenings.  Give medicines only as directed by the health care provider.  Monitor driving abilities. Do not allow the person to drive when safe driving is no longer possible.  Register with an identification program which could provide location assistance in the event of a missing person situation. SEEK MEDICAL CARE IF:   New behavioral problems start such as moodiness, aggressiveness, or seeing things that are not there (hallucinations).  Any new problem with brain function happens. This includes problems with balance, speech, or falling a lot.  Problems with swallowing develop.  Any symptoms of other illness happen. Small changes or worsening in any aspect of brain function can be a sign that the illness is getting worse. It can also be a sign of another medical illness such as infection. Seeing a health care provider right away is important. SEEK IMMEDIATE MEDICAL CARE IF:   A fever develops.  New or worsened confusion develops.  New or worsened sleepiness develops.  Staying awake becomes hard to do.   This information is not intended to replace advice given to you by your health care provider. Make sure you discuss any questions you have with your health care provider.   Document Released: 02/23/2001 Document Revised: 09/20/2014 Document  Reviewed: 01/25/2011 Elsevier Interactive Patient Education [employer].

## 2016-04-16 NOTE — Progress Notes (Signed)
Patient ID: Lynn Mcintosh, female   DOB: [birth year], 82 y.o.   MRN: [id number]    Location:  [facility] Place of Service: OFFICE    Advanced Directive information Does patient have an advance directive?: Yes, Type of Advance Directive: Healthcare Power of [place];Living will  Chief Complaint  Patient presents with  . Establish Care    new patient to establish care, Here with daughter in laws Lara Mulch, Henreitta Leber  . Dementia    short term memory    HPI:  82 yo female seen today as a new pt. Family c/a pt's short term memory loss. She is widowed x 4 yrs and currently lives with daughter-in-law and her son. She relocated from [place] to [place] where she lived with her son due to he was no longer able to care for her as his anal cancer recurred. Pt does not feel that she has any problems with her memory. Per family, she does not recall discussions that occurred 5 min prior. She has urinary incontinence. She is does not want to take any pills for anything.  She is a poor historian due to memory loss. Hx obtained from family and chart. She is a retired Catering manager - she was seen by eye specialist Dr Baird Cancer and was tx with eye gtts  And injections without change and she stopped going as "there was nothing else that could be done". She does not drive and gave up her license. No eye exam in several yrs. Her sister also has same eye disease.   Past Medical History:  Diagnosis Date  . Diverticulitis   . Hyperlipemia   . Hypertension   . Macular degeneration   . OA (osteoarthritis)     Past Surgical History:  Procedure Laterality Date  . APPENDECTOMY    . TONSILLECTOMY AND ADENOIDECTOMY      Patient Care Team: Gildardo Cranker, DO as PCP - General (Internal Medicine)  Social History   Social History  . Marital status: Widowed    Spouse name: N/A  . Number of children: N/A  . Years of education: 13 yrs    Occupational History  . retired     Social History Main Topics  . Smoking status: Never Smoker  . Smokeless tobacco: Never Used  . Alcohol use No  . Drug use: No  . Sexual activity: No   Other Topics Concern  . Not on file   Social History Narrative   Diet?       Do you drink/eat things with caffeine? yes      Marital status?          widowed                          What year were you married?      Do you live in a house, apartment, assisted living, condo, trailer, etc.? house      Is it one or more stories? One story ranch      How many persons live in your home? 2      Do you have any pets in your home? (please list) 3 cats      Current or past profession: school secretary      Do you exercise? no  Type & how often?      Do you have a living will? yes      Do you have a DNR form?  no                                If not, do you want to discuss one? yes      Do you have signed POA/HPOA for forms?  yes        reports that she has never smoked. She has never used smokeless tobacco. She reports that she does not drink alcohol or use drugs.  Family History  Problem Relation Age of Onset  . Cancer      family history   . Arthritis      family history   . Heart attack Mother 68  . Stroke Father 25  . Macular degeneration Sister   . Colon cancer Son     Recently diagnosed with Stage IV Gist tumor   Family Status  Relation Status  .    .    . Mother Deceased  . Father Deceased  . Sister Alive  . Son Alive  . Son Alive     There is no immunization history on file for this patient.  No Known Allergies  Medications: Patient's Medications  New Prescriptions   No medications on file  Previous Medications   No medications on file  Modified Medications   No medications on file  Discontinued Medications   LISINOPRIL PO    Take 1 tablet by mouth daily.    PSYLLIUM (METAMUCIL PO)    Take 1 tablet by mouth 2 (two) times daily.    Review of Systems   Eyes: Positive for visual disturbance.  Genitourinary:       Weak stream; urinary incontinence  Musculoskeletal: Positive for back pain.  Psychiatric/Behavioral: Positive for behavioral problems (aggressive).  per new pt packet  Vitals:   04/16/16 1103  BP: 118/78  Pulse: 84  Temp: 97.4 F (36.3 C)  TempSrc: Oral  SpO2: 95%  Weight: 166 lb 12.8 oz (75.7 kg)  Height: '5\' 4"'$  (1.626 m)   Body mass index is 28.63 kg/m.  Physical Exam  Constitutional: She appears well-developed and well-nourished. No distress.  HENT:  Mouth/Throat: Oropharynx is clear and moist. No oropharyngeal exudate.  Eyes: Pupils are equal, round, and reactive to light. No scleral icterus.  Neck: Neck supple. Carotid bruit is not present. No tracheal deviation present. No thyromegaly present.  Cardiovascular: Normal rate, regular rhythm, normal heart sounds and intact distal pulses.  Exam reveals no gallop and no friction rub.   No murmur heard. No LE edema b/l. no calf TTP.   Pulmonary/Chest: Effort normal and breath sounds normal. No stridor. No respiratory distress. She has no wheezes. She has no rales.  Abdominal: Soft. Bowel sounds are normal. She exhibits no distension and no mass. There is no hepatomegaly. There is no tenderness. There is no rebound and no guarding.  Musculoskeletal: She exhibits edema (R>L knee and finger joint swelling b/l).  Lymphadenopathy:    She has no cervical adenopathy.  Neurological: She is alert. She has normal reflexes. Gait (antalgic; uses cane to ambulate) abnormal.  Skin: Skin is warm and dry. No rash noted.  Psychiatric: She has a normal mood and affect. Her behavior is normal. Judgment and thought content normal.   MMSE - Mini Mental State Exam 04/16/2016  Orientation to  time 2  Orientation to Place 5  Registration 3  Attention/ Calculation 5  Recall 3  Language- name 2 objects 2  Language- repeat 1  Language- follow 3 step command 1  Language- read & follow  direction 1  Write a sentence 1  Copy design 0  Total score 24     Labs reviewed: Abstract on 04/12/2016  Component Date Value Ref Range Status  . HM Colonoscopy 10/11/2006 See Report  See Report, Patient Reported Normal Final    No results found.    Assessment/Plan   ICD-9-CM ICD-10-CM   1. Dementia, without behavioral disturbance - mild  294.20 F03.90 rivastigmine (EXELON) 4.6 mg/24hr     CBC with Differential/Platelets     CMP with eGFR     TSH     Urinalysis with Reflex Microscopic  2. Macular degeneration with probable legal blindness 362.50 H35.30      MMSE 24/30 - we discussed dementia tx options. Pt does not want to take oral meds and is hesitant to try patch but will do so for her family. Education handout given. She does not wonder  She declines f/u with ophthamology regarding vision. She is likely legally blind. She does not drive and does not live alone  Follow up in 1-2 mos for CPE/ECG  Will call with lab results   [place]. Perlie Gold  [facility] [address] [place], [place] [address] [phone] Cell (Monday-Friday 8 AM - 5 PM) [phone] After 5 PM and follow prompts

## 2016-04-17 LAB — URINALYSIS, ROUTINE W REFLEX MICROSCOPIC
Bilirubin Urine: NEGATIVE
Glucose, UA: NEGATIVE
Hgb urine dipstick: NEGATIVE
Ketones, ur: NEGATIVE
LEUKOCYTES UA: NEGATIVE
NITRITE: NEGATIVE
PROTEIN: NEGATIVE
SPECIFIC GRAVITY, URINE: 1.006 (ref 1.001–1.035)
pH: 7.5 (ref 5.0–8.0)

## 2016-04-19 ENCOUNTER — Other Ambulatory Visit: Payer: Self-pay

## 2016-04-19 DIAGNOSIS — N289 Disorder of kidney and ureter, unspecified: Secondary | ICD-10-CM

## 2016-04-19 LAB — T4, FREE: Free T4: 0.9 ng/dL (ref 0.8–1.8)

## 2016-04-27 ENCOUNTER — Other Ambulatory Visit: Payer: BC Managed Care – PPO

## 2016-07-02 ENCOUNTER — Ambulatory Visit (INDEPENDENT_AMBULATORY_CARE_PROVIDER_SITE_OTHER): Payer: Medicare Other | Admitting: Internal Medicine

## 2016-07-02 ENCOUNTER — Encounter: Payer: Self-pay | Admitting: Internal Medicine

## 2016-07-02 ENCOUNTER — Other Ambulatory Visit: Payer: Self-pay | Admitting: *Deleted

## 2016-07-02 VITALS — BP 118/78 | HR 89 | Temp 97.9°F | Ht 64.0 in | Wt 165.2 lb

## 2016-07-02 DIAGNOSIS — F0390 Unspecified dementia without behavioral disturbance: Secondary | ICD-10-CM | POA: Diagnosis not present

## 2016-07-02 DIAGNOSIS — H3530 Unspecified macular degeneration: Secondary | ICD-10-CM

## 2016-07-02 DIAGNOSIS — Z0000 Encounter for general adult medical examination without abnormal findings: Secondary | ICD-10-CM

## 2016-07-02 MED ORDER — RIVASTIGMINE 4.6 MG/24HR TD PT24: 4.6000 mg | MEDICATED_PATCH | Freq: Every day | TRANSDERMAL | 6 refills | Status: DC

## 2016-07-02 NOTE — Progress Notes (Signed)
Patient ID: Lynn Mcintosh, female   DOB: Aug 12, 1921, 82 y.o.   MRN: [id number]   Location:  [facility]  Place of Service:  OFFICE  Provider: Elmon KirschnerMONICA S Sally Reimers, DO  Patient Care Team: Kirt BoysMonica Elieser Tetrick, DO as PCP - General (Internal Medicine)  Extended Emergency Contact Information Primary Emergency Contact: Springston,Greg Address: [address]          [place], [place] [address] [place] Home Phone: [phone] Mobile Phone: [phone] Relation: Son Secondary Emergency Contact: Yadav,Carol Address: [address]          [place], [place] [address] [place] Home Phone: [phone] Relation: Relative  Code Status: FULLCODE Goals of Care: Advanced Directive information Advanced Directives 07/02/2016  Does patient have an advance directive? Yes  Type of Estate agentAdvance Directive Healthcare Power of [place];Living will  Does patient want to make changes to advanced directive? No - Patient declined  Copy of advanced directive(s) in chart? No - copy requested     Chief Complaint  Patient presents with  . Annual Exam    Yearly exam    HPI: Patient is a 82 y.o. female seen in today for an annual wellness exam.  She has no concerns. She is a poor historian due to dementia. Hx obtained from chart. Her granddaughter is present. She lives with son and daughter-in-law and feels isolated as she is not active in the community like she was when she lived in [place] with other son.  Dementia - she scored 24/30. She was Rx exelon patch at last OV but opted not to try it. She continues to have short term memory loss. She is a retired Theme park managerschool secretary.  Macular degeneration - she was seen by eye specialist Dr Allyne GeeSanders and was tx with eye gtts and injections without change and she stopped going as "there was nothing else that could be done". She does not drive and gave up her license. No eye exam in several yrs. Her sister also has same eye disease.    Depression screen [facility]  07/02/2016 04/16/2016  Decreased Interest 0 0  Down, Depressed, Hopeless 0 0  PHQ - 2 Score 0 0    Fall Risk  07/02/2016 04/16/2016  Falls in the past year? No No   MMSE - Mini Mental State Exam 04/16/2016  Orientation to time 2  Orientation to Place 5  Registration 3  Attention/ Calculation 5  Recall 3  Language- name 2 objects 2  Language- repeat 1  Language- follow 3 step command 1  Language- read & follow direction 1  Write a sentence 1  Copy design 0  Total score 24     Health Maintenance  Topic Date Due  . DEXA SCAN  04/02/1988  . PNA vac Low Risk Adult (1 of 2 - PCV13) 04/02/1988  . INFLUENZA VACCINE  04/13/2016  . ZOSTAVAX  04/29/2017 (Originally 04/03/1983)  . TETANUS/TDAP  04/29/2017 (Originally 04/02/1942)    Urinary incontinence? Yes but prefers not to take any medication  Functional Status Survey:  Is the patient deaf or have difficulty hearing?: No Does the patient have difficulty seeing, even when wearing glasses/contacts?: Yes Does the patient have difficulty concentrating, remembering, or making decisions?: Yes Does the patient have difficulty walking or climbing stairs?: Yes Does the patient have difficulty dressing or bathing?: No Does the patient have difficulty doing errands alone such as visiting a doctor's office or shopping?: Yes  Exercise? "Could get more"  Diet? Makes healthy food choices  No exam data present  Hearing: no issues    Dentition: followed by dentist  Pain: none  Past Medical History:  Diagnosis Date  . Diverticulitis   . Hyperlipemia   . Hypertension   . Macular degeneration   . OA (osteoarthritis)     Past Surgical History:  Procedure Laterality Date  . APPENDECTOMY    . TONSILLECTOMY AND ADENOIDECTOMY      Family History  Problem Relation Age of Onset  . Cancer      family history   . Arthritis      family history   . Heart attack Mother 79  . Stroke Father 13  . Macular degeneration Sister   . Colon  cancer Son     Recently diagnosed with Stage IV Gist tumor   Family Status  Relation Status  .    .    . Mother Deceased  . Father Deceased  . Sister Alive  . Son Alive  . Son Alive    Social History   Social History  . Marital status: Widowed    Spouse name: N/A  . Number of children: N/A  . Years of education: 13 yrs    Occupational History  . retired    Social History Main Topics  . Smoking status: Never Smoker  . Smokeless tobacco: Never Used  . Alcohol use No  . Drug use: No  . Sexual activity: No   Other Topics Concern  . Not on file   Social History Narrative   Diet?       Do you drink/eat things with caffeine? yes      Marital status?          widowed                          What year were you married?      Do you live in a house, apartment, assisted living, condo, trailer, etc.? house      Is it one or more stories? One story ranch      How many persons live in your home? 2      Do you have any pets in your home? (please list) 3 cats      Current or past profession: school secretary      Do you exercise? no                                     Type & how often?      Do you have a living will? yes      Do you have a DNR form?  no                                If not, do you want to discuss one? yes      Do you have signed POA/HPOA for forms?  yes       No Known Allergies    Medication List       Accurate as of 07/02/16 11:59 PM. Always use your most recent med list.          rivastigmine 4.6 mg/24hr Commonly known as:  EXELON Place 1 patch (4.6 mg total) onto the skin daily.        Review of Systems:  Review of Systems  Unable to perform ROS: Dementia    Physical Exam: Vitals:   07/02/16 1443  BP: 118/78  Pulse: 89  Temp: 97.9 F (36.6 C)  TempSrc: Oral  SpO2: 96%  Weight: 165 lb 3.2 oz (74.9 kg)  Height: 5\' 4"  (1.626 m)   Body mass index is 28.36 kg/m. Physical Exam  Constitutional: She appears  well-developed and well-nourished. No distress.  HENT:  Head: Normocephalic and atraumatic.  Right Ear: Hearing, tympanic membrane, external ear and ear canal normal.  Left Ear: Hearing, tympanic membrane, external ear and ear canal normal.  Mouth/Throat: Uvula is midline, oropharynx is clear and moist and mucous membranes are normal. She does not have dentures. No oropharyngeal exudate.  Eyes: Conjunctivae, EOM and lids are normal. Pupils are equal, round, and reactive to light. No scleral icterus.  Neck: Trachea normal and normal range of motion. Neck supple. Carotid bruit is not present. No tracheal deviation present. No thyroid mass and no thyromegaly present.  Cardiovascular: Normal rate, regular rhythm, normal heart sounds and intact distal pulses.  Exam reveals no gallop and no friction rub.   No murmur heard. No LE edema b/l. no calf TTP.   Pulmonary/Chest: Effort normal and breath sounds normal. No stridor. No respiratory distress. She has no wheezes. She has no rhonchi. She has no rales. Right breast exhibits no inverted nipple, no mass, no nipple discharge, no skin change and no tenderness. Left breast exhibits no inverted nipple, no mass, no nipple discharge, no skin change and no tenderness. Breasts are symmetrical.  Abdominal: Soft. Normal appearance, normal aorta and bowel sounds are normal. She exhibits no distension, no pulsatile midline mass and no mass. There is no hepatosplenomegaly or hepatomegaly. There is no tenderness. There is no rigidity, no rebound and no guarding. No hernia.  Musculoskeletal: She exhibits edema (R>L knee and finger joint swelling b/l).  Reduced ROM in large joints  Lymphadenopathy:       Head (right side): No posterior auricular adenopathy present.       Head (left side): No posterior auricular adenopathy present.    She has no cervical adenopathy.       Right: No supraclavicular adenopathy present.       Left: No supraclavicular adenopathy present.    Neurological: She is alert. She has normal strength. No cranial nerve deficit. Gait (antalgic; uses cane to ambulate) abnormal.  Skin: Skin is warm, dry and intact. No rash noted. Nails show no clubbing.  Psychiatric: She has a normal mood and affect. Her speech is normal and behavior is normal. Thought content normal. Cognition and memory are normal.    Labs reviewed:  Basic Metabolic Panel:  Recent Labs  16/10/96 1215  NA 141  K 3.9  CL 104  CO2 19*  GLUCOSE 96  BUN 24  CREATININE 1.66*  CALCIUM 9.1  TSH 4.69*   Liver Function Tests:  Recent Labs  04/16/16 1215  AST 20  ALT 9  ALKPHOS 54  BILITOT 0.5  PROT 6.9  ALBUMIN 4.2   No results for input(s): LIPASE, AMYLASE in the last 8760 hours. No results for input(s): AMMONIA in the last 8760 hours. CBC:  Recent Labs  04/16/16 1215  WBC 6.6  NEUTROABS 4,356  HGB 13.1  HCT 39.6  MCV 95.4  PLT 258   Lipid Panel: No results for input(s): CHOL, HDL, LDLCALC, TRIG, CHOLHDL, LDLDIRECT in the last 8760 hours. No results found for: HGBA1C  Procedures: No results found. ECG OBTAINED AND REVIEWED  BY MYSELF: NSR @ 72 bpm, LAD, LAE, poor R wave progression. No acute ischemic changes. No other ECG available to compare  Assessment/Plan   ICD-9-CM ICD-10-CM   1. Well adult exam V70.0 Z00.00   2. Dementia without behavioral disturbance, unspecified dementia type 294.20 F03.90   3. Macular degeneration 362.50 H35.30      Pt is UTD on health maintenance. Vaccinations are UTD. Pt maintains a healthy lifestyle. Encouraged pt to exercise 30-45 minutes 4-5 times per week. Eat a well balanced diet. Avoid smoking. Limit alcohol intake. Wear seatbelt when riding in the car. Wear sun block (SPF >50) when spending extended times outside.  RECOMMEND EXELON patch to help memory. She will think about it  Continue current medications as ordered  Follow up in 6 mos for routine visit  Ubah Radke S. Ancil Linsey   [facility] Adult [facility] [address] [place], [place] [address] [phone] Cell (Monday-Friday 8 AM - 5 PM) [phone] After 5 PM and follow prompts

## 2016-07-02 NOTE — Patient Instructions (Signed)
Encouraged pt to exercise 30-45 minutes 4-5 times per week. Eat a well balanced diet. Avoid smoking. Limit alcohol intake. Wear seatbelt when riding in the car. Wear sun block (SPF >50) when spending extended times outside.  RECOMMEND EXELON patch to help memory  Follow up in 6 mos for routine visit

## 2016-10-11 ENCOUNTER — Telehealth: Payer: Self-pay | Admitting: *Deleted

## 2016-10-11 DIAGNOSIS — R32 Unspecified urinary incontinence: Secondary | ICD-10-CM

## 2016-10-11 NOTE — Telephone Encounter (Signed)
Pryor Ochoaarol Lonon, Caregiver called and wanted to know if you will sign a DNR for patient. Stated they sent her to the brothers house in [place] last year when caregiver's  husband got cancer. Now the brother can't do it anymore and [place]. Patient is going to be 89 and doesn't want anything done. [place] [facility] on [place].  2. Incontinence is getting worse and they want a Rx for Adult Large pull ups. Please Advise.

## 2016-10-11 NOTE — Telephone Encounter (Signed)
Ok to both 

## 2016-10-12 ENCOUNTER — Encounter: Payer: Self-pay | Admitting: Internal Medicine

## 2016-10-13 MED ORDER — AMBULATORY NON FORMULARY MEDICATION: 11 refills | Status: DC

## 2016-10-13 NOTE — Telephone Encounter (Signed)
DNR filled out and Rx for pull ups printed. Lynn RegalCarol notified and agreed and will pick up Friday.

## 2016-11-10 ENCOUNTER — Telehealth: Payer: Self-pay | Admitting: *Deleted

## 2016-11-10 NOTE — Telephone Encounter (Signed)
Son, Tammy SoursGreg called and stated that [facility] has faxed over orders for patient to receive OT/PT and we have not responded. I have not received these orders. [facility] [phone] and [facility] for them to return my call. Son stated to speak with Teresa PeltonKathy Munson.  Awaiting a call back from them.

## 2016-11-10 NOTE — Telephone Encounter (Signed)
[facility] [place] #[phone] for OT/PT for Symbolic dysfunction, Muscle Weakness, Falls, Move to higher lever of Care.  Frequency TBD upon evaluation.  Order given to Dr. Montez Moritaarter to review and sign and fax back to #[phone]

## 2017-01-07 ENCOUNTER — Ambulatory Visit: Payer: Medicare Other | Admitting: Internal Medicine

## 2017-02-11 ENCOUNTER — Ambulatory Visit (INDEPENDENT_AMBULATORY_CARE_PROVIDER_SITE_OTHER): Payer: Medicare Other | Admitting: Internal Medicine

## 2017-02-11 ENCOUNTER — Encounter: Payer: Self-pay | Admitting: Internal Medicine

## 2017-02-11 VITALS — BP 138/78 | HR 74 | Temp 98.3°F | Ht 64.0 in | Wt 157.4 lb

## 2017-02-11 DIAGNOSIS — Z23 Encounter for immunization: Secondary | ICD-10-CM | POA: Diagnosis not present

## 2017-02-11 DIAGNOSIS — F0390 Unspecified dementia without behavioral disturbance: Secondary | ICD-10-CM

## 2017-02-11 DIAGNOSIS — R630 Anorexia: Secondary | ICD-10-CM | POA: Diagnosis not present

## 2017-02-11 DIAGNOSIS — H3530 Unspecified macular degeneration: Secondary | ICD-10-CM

## 2017-02-11 DIAGNOSIS — R634 Abnormal weight loss: Secondary | ICD-10-CM | POA: Diagnosis not present

## 2017-02-11 DIAGNOSIS — R062 Wheezing: Secondary | ICD-10-CM | POA: Diagnosis not present

## 2017-02-11 DIAGNOSIS — R9389 Abnormal findings on diagnostic imaging of other specified body structures: Secondary | ICD-10-CM

## 2017-02-11 DIAGNOSIS — R938 Abnormal findings on diagnostic imaging of other specified body structures: Secondary | ICD-10-CM | POA: Diagnosis not present

## 2017-02-11 LAB — COMPLETE METABOLIC PANEL WITH GFR
ALBUMIN: 3.5 g/dL — AB (ref 3.6–5.1)
ALK PHOS: 67 U/L (ref 33–130)
ALT: 7 U/L (ref 6–29)
AST: 14 U/L (ref 10–35)
BUN: 14 mg/dL (ref 7–25)
CALCIUM: 8.7 mg/dL (ref 8.6–10.4)
CHLORIDE: 109 mmol/L (ref 98–110)
CO2: 25 mmol/L (ref 20–31)
CREATININE: 1.28 mg/dL — AB (ref 0.60–0.88)
GFR, EST AFRICAN AMERICAN: 42 mL/min — AB (ref >=60)
GFR, Est Non African American: 36 mL/min — ABNORMAL LOW (ref >=60)
Glucose, Bld: 89 mg/dL (ref 65–99)
POTASSIUM: 4.2 mmol/L (ref 3.5–5.3)
Sodium: 142 mmol/L (ref 135–146)
Total Bilirubin: 0.4 mg/dL (ref 0.2–1.2)
Total Protein: 5.8 g/dL — ABNORMAL LOW (ref 6.1–8.1)

## 2017-02-11 LAB — CBC WITH DIFFERENTIAL/PLATELET
Basophils Absolute: 56 cells/uL (ref 0–200)
Basophils Relative: 1 %
EOS ABS: 56 {cells}/uL (ref 15–500)
Eosinophils Relative: 1 %
HEMATOCRIT: 37.1 % (ref 35.0–45.0)
HEMOGLOBIN: 11.9 g/dL (ref 11.7–15.5)
LYMPHS ABS: 1176 {cells}/uL (ref 850–3900)
LYMPHS PCT: 21 %
MCH: 30.1 pg (ref 27.0–33.0)
MCHC: 32.1 g/dL (ref 32.0–36.0)
MCV: 93.9 fL (ref 80.0–100.0)
MONO ABS: 672 {cells}/uL (ref 200–950)
MPV: 10 fL (ref 7.5–12.5)
Monocytes Relative: 12 %
NEUTROS PCT: 65 %
Neutro Abs: 3640 cells/uL (ref 1500–7800)
Platelets: 219 10*3/uL (ref 140–400)
RBC: 3.95 MIL/uL (ref 3.80–5.10)
RDW: 14.7 % (ref 11.0–15.0)
WBC: 5.6 10*3/uL (ref 3.8–10.8)

## 2017-02-11 NOTE — Progress Notes (Signed)
Patient ID: Lynn Mcintosh, female   DOB: 21-Jul-1921, 83 y.o.   MRN: [id number]    Location:  [facility] Place of Service: OFFICE  Chief Complaint  Patient presents with  . Medical Management of Chronic Issues    6 month routine visit  . Weight Loss  . Memory Loss    increased memory loss    HPI:  83 yo female seen today for f/u. Family c/a MS. She has declined in last 4 mos. She was taken to [facility] for cough and was dx with atypical pneumonia in Feb 2018 and rx zpak which she completed. A mass was reportedly noted on CXR but that report was not available at time of OV today. Last MMSE 24/30 in Aug 2017. She admits to reduced appetite. She is upset because she now lives at [facility]. She is a poor historian due to dementia. Hx obtained from chart and daughter-in-laws. No hemoptysis or night sweats.   Dementia - MMSE 24/30 in Aug 2017. She was Rx exelon patch at last OV but opted not to try it. She continues to have short term memory loss and family also reports that she does not bathe or change her clothes. She must be cued to take her medications. She is a retired Restaurant manager, fast food. She now lives at [facility] - she was seen by eye specialist Dr Baird Cancer and was tx with eye gtts and injections without change and she stopped going as "there was nothing else that could be done". She does not drive and gave up her license. No eye exam in several yrs. Her sister also has same eye disease.   Past Medical History:  Diagnosis Date  . Diverticulitis   . Hyperlipemia   . Hypertension   . Macular degeneration   . OA (osteoarthritis)     Past Surgical History:  Procedure Laterality Date  . APPENDECTOMY    . TONSILLECTOMY AND ADENOIDECTOMY      Patient Care Team: Gildardo Cranker, DO as PCP - General (Internal Medicine)  Social History   Social History  . Marital status: Widowed    Spouse name: N/A  . Number of children: N/A  . Years  of education: 13 yrs    Occupational History  . retired    Social History Main Topics  . Smoking status: Never Smoker  . Smokeless tobacco: Never Used  . Alcohol use No  . Drug use: No  . Sexual activity: No   Other Topics Concern  . Not on file   Social History Narrative   Diet?       Do you drink/eat things with caffeine? yes      Marital status?          widowed                          What year were you married?      Do you live in a house, apartment, assisted living, condo, trailer, etc.? house      Is it one or more stories? One story ranch      How many persons live in your home? 2      Do you have any pets in your home? (please list) 3 cats      Current or past profession: school secretary      Do you exercise? no  Type & how often?      Do you have a living will? yes      Do you have a DNR form?  no                                If not, do you want to discuss one? yes      Do you have signed POA/HPOA for forms?  yes        reports that she has never smoked. She has never used smokeless tobacco. She reports that she does not drink alcohol or use drugs.  Family History  Problem Relation Age of Onset  . Cancer Unknown        family history   . Arthritis Unknown        family history   . Heart attack Mother 46  . Stroke Father 37  . Macular degeneration Sister   . Colon cancer Son        Recently diagnosed with Stage IV Gist tumor   Family Status  Relation Status  . Unknown (Not Specified)  . Unknown (Not Specified)  . Mother Deceased  . Father Deceased  . Sister Alive  . Son Alive  . Son Alive     No Known Allergies  Medications: Patient's Medications  New Prescriptions   No medications on file  Previous Medications   AMBULATORY NON FORMULARY MEDICATION    Adult Large Pull ups BP:ZWCHENI Incontinence R32  Modified Medications   No medications on file  Discontinued Medications   RIVASTIGMINE  (EXELON) 4.6 MG/24HR    Place 1 patch (4.6 mg total) onto the skin daily.    Review of Systems  Unable to perform ROS: Dementia    Vitals:   02/11/17 1555  BP: 138/78  Pulse: 74  Temp: 98.3 F (36.8 C)  TempSrc: Oral  SpO2: 97%  Weight: 157 lb 6.4 oz (71.4 kg)  Height: '5\' 4"'$  (1.626 m)   Body mass index is 27.02 kg/m.  Physical Exam  Constitutional: She appears well-developed and well-nourished. No distress.  HENT:  Mouth/Throat: Oropharynx is clear and moist. No oropharyngeal exudate.  Eyes: Pupils are equal, round, and reactive to light. No scleral icterus.  Neck: Neck supple. Carotid bruit is not present. No tracheal deviation present. No thyromegaly present.  Cardiovascular: Normal rate, regular rhythm, normal heart sounds and intact distal pulses.  Exam reveals no gallop and no friction rub.   No murmur heard. +1 pitting LE edema b/l. no calf TTP.   Pulmonary/Chest: Effort normal. No stridor. No respiratory distress. She has wheezes (end expiratory with cough; prolonged expiratory phase). She has no rales. She exhibits no tenderness.  Reduced BS at right base  Abdominal: Soft. Bowel sounds are normal. She exhibits no distension and no mass. There is no hepatomegaly. There is no tenderness. There is no rebound and no guarding.  Musculoskeletal: She exhibits edema (R>L knee and finger joint swelling b/l).  Lymphadenopathy:    She has no cervical adenopathy.  Neurological: She is alert. She has normal reflexes. Gait (antalgic; uses cane to ambulate) abnormal.  She is oriented to person and place. She cannot recall what she ate for lunch.  Skin: Skin is warm and dry. No rash noted.  Psychiatric: She has a normal mood and affect. Her behavior is normal. Judgment and thought content normal.     Labs reviewed: No visits with results within 3 Month(s)  from this visit.  Latest known visit with results is:  Orders Only on 04/16/2016  Component Date Value Ref Range Status  .  Free T4 04/16/2016 0.9  0.8 - 1.8 ng/dL Final    No results found.   Assessment/Plan   ICD-9-CM ICD-10-CM   1. Weight loss 783.21 R63.4 CBC with Differential/Platelets     CMP with eGFR     TSH     Urinalysis with Reflex Microscopic     DG Chest 2 View  2. Abnormal chest x-ray 793.2 R93.8 CBC with Differential/Platelets     CMP with eGFR     DG Chest 2 View  3. Wheezing 786.07 R06.2 DG Chest 2 View  4. Decreased appetite 783.0 R63.0 CBC with Differential/Platelets     CMP with eGFR     Urinalysis with Reflex Microscopic  5. Dementia without behavioral disturbance, unspecified dementia type - unchanged MMSE score 24/30; failed clock drawing test 294.20 F03.90   6. Macular degeneration, unspecified laterality, unspecified type 362.50 H35.30   7. Need for 23-polyvalent pneumococcal polysaccharide vaccine V03.82 Z23 Pneumococcal polysaccharide vaccine 23-valent greater than or equal to 2yo subcutaneous/IM   She has agreed to pneumovax which was given today  She may need pulmonary eval pending xray results. C/a possible mass as reported at [facility] xray  Will call with xray and lab results  She declines any medication to improve her cognition. She emphasis 'I'm fine"  Records from [facility] requested today  Follow up in 3 mos for weight loss and dementia  Tyberius Ryner S. Perlie Gold  [facility] [address] [place], [place] [address] [phone] Cell (Monday-Friday 8 AM - 5 PM) [phone] After 5 PM and follow prompts

## 2017-02-11 NOTE — Patient Instructions (Signed)
Pneumovax given today  Will call with xray and lab results  Follow up in 3 mos for weight loss and dementia

## 2017-02-12 LAB — URINALYSIS, ROUTINE W REFLEX MICROSCOPIC
BILIRUBIN URINE: NEGATIVE
GLUCOSE, UA: NEGATIVE
HGB URINE DIPSTICK: NEGATIVE
KETONES UR: NEGATIVE
Leukocytes, UA: NEGATIVE
Nitrite: NEGATIVE
PROTEIN: NEGATIVE
Specific Gravity, Urine: 1.014 (ref 1.001–1.035)
pH: 6.5 (ref 5.0–8.0)

## 2017-02-12 LAB — TSH: TSH: 4.03 m[IU]/L

## 2017-02-18 ENCOUNTER — Ambulatory Visit
Admission: RE | Admit: 2017-02-18 | Discharge: 2017-02-18 | Disposition: A | Discharge status: Home / Self Care | Payer: Medicare Other | Source: Ambulatory Visit | Attending: Internal Medicine | Admitting: Internal Medicine

## 2017-02-18 DIAGNOSIS — R062 Wheezing: Secondary | ICD-10-CM

## 2017-02-18 DIAGNOSIS — R9389 Abnormal findings on diagnostic imaging of other specified body structures: Secondary | ICD-10-CM

## 2017-02-18 DIAGNOSIS — R634 Abnormal weight loss: Secondary | ICD-10-CM

## 2017-05-25 ENCOUNTER — Ambulatory Visit: Payer: Self-pay | Admitting: Internal Medicine

## 2017-07-20 ENCOUNTER — Ambulatory Visit: Payer: Self-pay | Admitting: Internal Medicine

## 2017-11-02 ENCOUNTER — Telehealth: Payer: Self-pay | Admitting: Internal Medicine

## 2017-11-02 NOTE — Telephone Encounter (Signed)
I called Enid CutterMary Ann Fulford because the patient is due for an AWV and follow up appt w/ Dr. Montez Moritaarter.  She stated that [facility] and her dementia has gotten worse, but she is in good health.  She doesn't wish to make a follow up appt at this time because of the trouble she has trying to get her in the office.  She will call us if she feels the patient needs to be seen. VDM (DD)

## 2018-05-03 ENCOUNTER — Encounter: Payer: Self-pay | Admitting: Internal Medicine

## 2018-09-16 ENCOUNTER — Encounter (HOSPITAL_COMMUNITY): Payer: Self-pay

## 2018-09-16 ENCOUNTER — Inpatient Hospital Stay (HOSPITAL_COMMUNITY): Payer: Medicare Other

## 2018-09-16 ENCOUNTER — Emergency Department (HOSPITAL_COMMUNITY): Payer: Medicare Other

## 2018-09-16 ENCOUNTER — Inpatient Hospital Stay (HOSPITAL_COMMUNITY)
Admission: EM | Admit: 2018-09-16 | Discharge: 2018-09-19 | DRG: 065 | Disposition: A | Discharge status: Skilled Nursing Facility | Payer: Medicare Other | Attending: Oncology | Admitting: Oncology

## 2018-09-16 DIAGNOSIS — R4701 Aphasia: Secondary | ICD-10-CM | POA: Diagnosis present

## 2018-09-16 DIAGNOSIS — E860 Dehydration: Secondary | ICD-10-CM | POA: Diagnosis present

## 2018-09-16 DIAGNOSIS — N182 Chronic kidney disease, stage 2 (mild): Secondary | ICD-10-CM | POA: Diagnosis present

## 2018-09-16 DIAGNOSIS — Z7982 Long term (current) use of aspirin: Secondary | ICD-10-CM | POA: Diagnosis not present

## 2018-09-16 DIAGNOSIS — R32 Unspecified urinary incontinence: Secondary | ICD-10-CM | POA: Diagnosis present

## 2018-09-16 DIAGNOSIS — H3530 Unspecified macular degeneration: Secondary | ICD-10-CM | POA: Diagnosis present

## 2018-09-16 DIAGNOSIS — I10 Essential (primary) hypertension: Secondary | ICD-10-CM | POA: Diagnosis not present

## 2018-09-16 DIAGNOSIS — Z8261 Family history of arthritis: Secondary | ICD-10-CM | POA: Diagnosis not present

## 2018-09-16 DIAGNOSIS — I491 Atrial premature depolarization: Secondary | ICD-10-CM | POA: Diagnosis not present

## 2018-09-16 DIAGNOSIS — N289 Disorder of kidney and ureter, unspecified: Secondary | ICD-10-CM | POA: Diagnosis not present

## 2018-09-16 DIAGNOSIS — I340 Nonrheumatic mitral (valve) insufficiency: Secondary | ICD-10-CM | POA: Diagnosis present

## 2018-09-16 DIAGNOSIS — R471 Dysarthria and anarthria: Secondary | ICD-10-CM | POA: Diagnosis present

## 2018-09-16 DIAGNOSIS — Z823 Family history of stroke: Secondary | ICD-10-CM | POA: Diagnosis not present

## 2018-09-16 DIAGNOSIS — I63343 Cerebral infarction due to thrombosis of bilateral cerebellar arteries: Principal | ICD-10-CM | POA: Diagnosis present

## 2018-09-16 DIAGNOSIS — R824 Acetonuria: Secondary | ICD-10-CM | POA: Diagnosis not present

## 2018-09-16 DIAGNOSIS — M1990 Unspecified osteoarthritis, unspecified site: Secondary | ICD-10-CM | POA: Diagnosis present

## 2018-09-16 DIAGNOSIS — I639 Cerebral infarction, unspecified: Secondary | ICD-10-CM

## 2018-09-16 DIAGNOSIS — M25551 Pain in right hip: Secondary | ICD-10-CM

## 2018-09-16 DIAGNOSIS — F0390 Unspecified dementia without behavioral disturbance: Secondary | ICD-10-CM | POA: Diagnosis present

## 2018-09-16 DIAGNOSIS — R531 Weakness: Secondary | ICD-10-CM

## 2018-09-16 DIAGNOSIS — R29810 Facial weakness: Secondary | ICD-10-CM | POA: Diagnosis present

## 2018-09-16 DIAGNOSIS — I63543 Cerebral infarction due to unspecified occlusion or stenosis of bilateral cerebellar arteries: Secondary | ICD-10-CM | POA: Diagnosis not present

## 2018-09-16 DIAGNOSIS — I4891 Unspecified atrial fibrillation: Secondary | ICD-10-CM | POA: Diagnosis not present

## 2018-09-16 DIAGNOSIS — E876 Hypokalemia: Secondary | ICD-10-CM | POA: Diagnosis present

## 2018-09-16 DIAGNOSIS — R7989 Other specified abnormal findings of blood chemistry: Secondary | ICD-10-CM | POA: Diagnosis not present

## 2018-09-16 DIAGNOSIS — M6282 Rhabdomyolysis: Secondary | ICD-10-CM | POA: Diagnosis present

## 2018-09-16 DIAGNOSIS — I129 Hypertensive chronic kidney disease with stage 1 through stage 4 chronic kidney disease, or unspecified chronic kidney disease: Secondary | ICD-10-CM | POA: Diagnosis present

## 2018-09-16 DIAGNOSIS — N179 Acute kidney failure, unspecified: Secondary | ICD-10-CM | POA: Diagnosis present

## 2018-09-16 DIAGNOSIS — R011 Cardiac murmur, unspecified: Secondary | ICD-10-CM | POA: Diagnosis not present

## 2018-09-16 DIAGNOSIS — R29709 NIHSS score 9: Secondary | ICD-10-CM | POA: Diagnosis present

## 2018-09-16 DIAGNOSIS — Z800 Family history of malignant neoplasm of digestive organs: Secondary | ICD-10-CM | POA: Diagnosis not present

## 2018-09-16 DIAGNOSIS — Z8249 Family history of ischemic heart disease and other diseases of the circulatory system: Secondary | ICD-10-CM

## 2018-09-16 DIAGNOSIS — I63413 Cerebral infarction due to embolism of bilateral middle cerebral arteries: Secondary | ICD-10-CM | POA: Diagnosis not present

## 2018-09-16 DIAGNOSIS — T796XXD Traumatic ischemia of muscle, subsequent encounter: Secondary | ICD-10-CM | POA: Diagnosis not present

## 2018-09-16 DIAGNOSIS — G8191 Hemiplegia, unspecified affecting right dominant side: Secondary | ICD-10-CM | POA: Diagnosis not present

## 2018-09-16 DIAGNOSIS — E785 Hyperlipidemia, unspecified: Secondary | ICD-10-CM | POA: Diagnosis present

## 2018-09-16 DIAGNOSIS — R4781 Slurred speech: Secondary | ICD-10-CM | POA: Diagnosis not present

## 2018-09-16 LAB — DIFFERENTIAL
Abs Immature Granulocytes: 0.1 10*3/uL — ABNORMAL HIGH (ref 0.00–0.07)
Basophils Absolute: 0 10*3/uL (ref 0.0–0.1)
Basophils Relative: 0 %
EOS ABS: 0 10*3/uL (ref 0.0–0.5)
Eosinophils Relative: 0 %
Immature Granulocytes: 1 %
LYMPHS ABS: 1 10*3/uL (ref 0.7–4.0)
Lymphocytes Relative: 7 %
Monocytes Absolute: 1.4 10*3/uL — ABNORMAL HIGH (ref 0.1–1.0)
Monocytes Relative: 10 %
NEUTROS PCT: 82 %
Neutro Abs: 12.3 10*3/uL — ABNORMAL HIGH (ref 1.7–7.7)

## 2018-09-16 LAB — I-STAT CHEM 8, ED
BUN: 23 mg/dL (ref 8–23)
CREATININE: 1.4 mg/dL — AB (ref 0.44–1.00)
Calcium, Ion: 1.01 mmol/L — ABNORMAL LOW (ref 1.15–1.40)
Chloride: 104 mmol/L (ref 98–111)
Glucose, Bld: 116 mg/dL — ABNORMAL HIGH (ref 70–99)
HCT: 46 % (ref 36.0–46.0)
Hemoglobin: 15.6 g/dL — ABNORMAL HIGH (ref 12.0–15.0)
Potassium: 3.2 mmol/L — ABNORMAL LOW (ref 3.5–5.1)
Sodium: 136 mmol/L (ref 135–145)
TCO2: 22 mmol/L (ref 22–32)

## 2018-09-16 LAB — CBC
HCT: 45.2 % (ref 36.0–46.0)
Hemoglobin: 14.9 g/dL (ref 12.0–15.0)
MCH: 31.2 pg (ref 26.0–34.0)
MCHC: 33 g/dL (ref 30.0–36.0)
MCV: 94.6 fL (ref 80.0–100.0)
Platelets: 322 10*3/uL (ref 150–400)
RBC: 4.78 MIL/uL (ref 3.87–5.11)
RDW: 13.1 % (ref 11.5–15.5)
WBC: 14.8 10*3/uL — ABNORMAL HIGH (ref 4.0–10.5)
nRBC: 0 % (ref 0.0–0.2)

## 2018-09-16 LAB — URINALYSIS, ROUTINE W REFLEX MICROSCOPIC
BILIRUBIN URINE: NEGATIVE
Bacteria, UA: NONE SEEN
GLUCOSE, UA: NEGATIVE mg/dL
Ketones, ur: 20 mg/dL — AB
Leukocytes, UA: NEGATIVE
Nitrite: NEGATIVE
Protein, ur: 100 mg/dL — AB
Specific Gravity, Urine: 1.011 (ref 1.005–1.030)
pH: 6 (ref 5.0–8.0)

## 2018-09-16 LAB — COMPREHENSIVE METABOLIC PANEL
ALT: 19 U/L (ref 0–44)
AST: 66 U/L — ABNORMAL HIGH (ref 15–41)
Albumin: 3.7 g/dL (ref 3.5–5.0)
Alkaline Phosphatase: 83 U/L (ref 38–126)
Anion gap: 15 (ref 5–15)
BUN: 20 mg/dL (ref 8–23)
CO2: 19 mmol/L — ABNORMAL LOW (ref 22–32)
Calcium: 8.8 mg/dL — ABNORMAL LOW (ref 8.9–10.3)
Chloride: 102 mmol/L (ref 98–111)
Creatinine, Ser: 1.98 mg/dL — ABNORMAL HIGH (ref 0.44–1.00)
GFR calc Af Amer: 24 mL/min — ABNORMAL LOW (ref >60)
GFR calc non Af Amer: 21 mL/min — ABNORMAL LOW (ref >60)
Glucose, Bld: 122 mg/dL — ABNORMAL HIGH (ref 70–99)
Potassium: 3 mmol/L — ABNORMAL LOW (ref 3.5–5.1)
Sodium: 136 mmol/L (ref 135–145)
Total Bilirubin: 0.5 mg/dL (ref 0.3–1.2)
Total Protein: 7.3 g/dL (ref 6.5–8.1)

## 2018-09-16 LAB — APTT: aPTT: 26 seconds (ref 24–36)

## 2018-09-16 LAB — I-STAT TROPONIN, ED: Troponin i, poc: 1.11 ng/mL (ref 0.00–0.08)

## 2018-09-16 LAB — PROTIME-INR
INR: 1.03
Prothrombin Time: 13.4 seconds (ref 11.4–15.2)

## 2018-09-16 LAB — CK: Total CK: 982 U/L — ABNORMAL HIGH (ref 38–234)

## 2018-09-16 MED ORDER — ACETAMINOPHEN 650 MG RE SUPP: 650.0000 mg | RECTAL | Status: DC | PRN

## 2018-09-16 MED ORDER — ENOXAPARIN SODIUM 30 MG/0.3ML ~~LOC~~ SOLN
30.0000 mg | Freq: Every day | SUBCUTANEOUS | Status: DC
  Administered 2018-09-17 – 2018-09-19 (×3): 30 mg via SUBCUTANEOUS
  Filled 2018-09-16 (×4): qty 0.3

## 2018-09-16 MED ORDER — ACETAMINOPHEN 325 MG PO TABS: 650.0000 mg | ORAL_TABLET | ORAL | Status: DC | PRN

## 2018-09-16 MED ORDER — POTASSIUM CHLORIDE CRYS ER 20 MEQ PO TBCR
40.0000 meq | EXTENDED_RELEASE_TABLET | Freq: Once | ORAL | Status: AC
  Administered 2018-09-16: 40 meq via ORAL
  Filled 2018-09-16: qty 2

## 2018-09-16 MED ORDER — STROKE: EARLY STAGES OF RECOVERY BOOK
Freq: Once | Status: AC
  Administered 2018-09-16: 23:00:00
  Filled 2018-09-16: qty 1

## 2018-09-16 MED ORDER — ASPIRIN EC 325 MG PO TBEC
325.0000 mg | DELAYED_RELEASE_TABLET | Freq: Every day | ORAL | Status: DC
  Administered 2018-09-17 – 2018-09-19 (×3): 325 mg via ORAL
  Filled 2018-09-16 (×3): qty 1

## 2018-09-16 MED ORDER — ACETAMINOPHEN 160 MG/5ML PO SOLN: 650.0000 mg | ORAL | Status: DC | PRN

## 2018-09-16 MED ORDER — LACTATED RINGERS IV SOLN
INTRAVENOUS | Status: DC
  Administered 2018-09-16 – 2018-09-19 (×6): via INTRAVENOUS

## 2018-09-16 MED ORDER — ASPIRIN 81 MG PO CHEW
324.0000 mg | CHEWABLE_TABLET | Freq: Once | ORAL | Status: AC
  Administered 2018-09-16: 324 mg via ORAL
  Filled 2018-09-16: qty 4

## 2018-09-16 MED ORDER — SODIUM CHLORIDE 0.9 % IV BOLUS
1000.0000 mL | Freq: Once | INTRAVENOUS | Status: AC
  Administered 2018-09-16: 1000 mL via INTRAVENOUS

## 2018-09-16 MED ORDER — SENNOSIDES-DOCUSATE SODIUM 8.6-50 MG PO TABS: 1.0000 | ORAL_TABLET | Freq: Every evening | ORAL | Status: DC | PRN

## 2018-09-16 NOTE — ED Triage Notes (Signed)
To triage via EMS.  EMS was called to patients home at 11:45am because pts friend found pt on floor and pt urinated on self.  Pt does not remember falling.  EMS did not find any abnormalities.  Family arrived at 2pm and thought pt had slurred speech.  No facial droop or weakness.   1854 pts daughter arrived and reported that pt lives at [facility] assisted living.  Staff advised that pt has not been seen at dining hall in several days.  Pts friends have been trying to reach pt by phone for several days and unsuccessful.   BP 123/72 HR 95 SpO2 96%  CBG 180 Uses cane and walker.

## 2018-09-16 NOTE — ED Notes (Signed)
I Stat Trop I of  1.11 reported to J. Long

## 2018-09-16 NOTE — ED Provider Notes (Signed)
Emergency Department Provider Note   I have reviewed the triage vital signs and the nursing notes.   HISTORY  Chief Complaint Aphasia   HPI Lynn Mcintosh is a 85 y.o. female with PMH of HLD, HTN, Dementia, and OA presents to the emergency department for evaluation after being found down in her independent living facility.  Patient has her own apartment and is responsible for getting herself to meals.  Family has an aide that comes in twice per week to help with housekeeping and bathing.  The facility did not see the patient at any meals today or yesterday.  The last time she was seen outside of her room was 2 days ago at lunch.  The family went over to check on her and found her on the ground.  Patient is unable to tell us what happened or how long she was there.  She denies any pain at this time.  Specifically patient denies any chest pain, shortness of breath, headache, or pain in the arms and legs.  Family state that the patient seems like she has some slurred speech and is more fatigued than normal.   Level 5 caveat: Dementia   Past Medical History:  Diagnosis Date  . Diverticulitis   . Hyperlipemia   . Hypertension   . Macular degeneration   . OA (osteoarthritis)     Patient Active Problem List   Diagnosis Date Noted  . Rhabdomyolysis 09/16/2018  . Weight loss 02/11/2017  . Abnormal chest x-ray 02/11/2017  . Dementia without behavioral disturbance (HCC) 04/16/2016  . Macular degeneration 04/16/2016    Past Surgical History:  Procedure Laterality Date  . APPENDECTOMY    . TONSILLECTOMY AND ADENOIDECTOMY      Allergies Patient has no known allergies.  Family History  Problem Relation Age of Onset  . Cancer Other        family history   . Arthritis Other        family history   . Heart attack Mother 56  . Stroke Father 76  . Macular degeneration Sister   . Colon cancer Son        Recently diagnosed with Stage IV Gist tumor    Social History Social  History   Tobacco Use  . Smoking status: Never Smoker  . Smokeless tobacco: Never Used  Substance Use Topics  . Alcohol use: No  . Drug use: No    Review of Systems  Constitutional: No fever/chills Eyes: No visual changes. ENT: No sore throat. Cardiovascular: Denies chest pain. Respiratory: Denies shortness of breath. Gastrointestinal: No abdominal pain.  No nausea, no vomiting.  No diarrhea.  No constipation. Genitourinary: Negative for dysuria. Musculoskeletal: Negative for back pain. Skin: Negative for rash. Neurological: Negative for headaches, focal weakness or numbness.  10-point ROS otherwise negative.  ____________________________________________   PHYSICAL EXAM:  VITAL SIGNS: ED Triage Vitals  Enc Vitals Group     BP 09/16/18 1742 (!) 116/51     Pulse Rate 09/16/18 1742 97     Resp 09/16/18 1742 16     Temp --      Temp src --      SpO2 09/16/18 1742 98 %     Pain Score 09/16/18 1748 0   Constitutional: Alert but confused. Well appearing and in no acute distress. Eyes: Conjunctivae are normal. PERRL.  Head: Atraumatic. Nose: No congestion/rhinnorhea. Mouth/Throat: Mucous membranes are somewhat dry.  Neck: No stridor.  Cardiovascular: Normal rate, regular rhythm. Good peripheral  circulation. Grossly normal heart sounds.   Respiratory: Normal respiratory effort.  No retractions. Lungs CTAB. Gastrointestinal: Soft and nontender. No distention.  Musculoskeletal: No lower extremity tenderness nor edema. No gross deformities of extremities. Normal passive ROM of the bilateral upper and lower extremities.  Neurologic:  Normal speech and language. No gross focal neurologic deficits are appreciated.  Skin:  Skin is warm, dry and intact. No rash noted.  ____________________________________________   LABS (all labs ordered are listed, but only abnormal results are displayed)  Labs Reviewed  CBC - Abnormal; Notable for the following components:      Result  Value   WBC 14.8 (*)    All other components within normal limits  DIFFERENTIAL - Abnormal; Notable for the following components:   Neutro Abs 12.3 (*)    Monocytes Absolute 1.4 (*)    Abs Immature Granulocytes 0.10 (*)    All other components within normal limits  COMPREHENSIVE METABOLIC PANEL - Abnormal; Notable for the following components:   Potassium 3.0 (*)    CO2 19 (*)    Glucose, Bld 122 (*)    Creatinine, Ser 1.98 (*)    Calcium 8.8 (*)    AST 66 (*)    GFR calc non Af Amer 21 (*)    GFR calc Af Amer 24 (*)    All other components within normal limits  CK - Abnormal; Notable for the following components:   Total CK 982 (*)    All other components within normal limits  URINALYSIS, ROUTINE W REFLEX MICROSCOPIC - Abnormal; Notable for the following components:   Hgb urine dipstick MODERATE (*)    Ketones, ur 20 (*)    Protein, ur 100 (*)    All other components within normal limits  I-STAT TROPONIN, ED - Abnormal; Notable for the following components:   Troponin i, poc 1.11 (*)    All other components within normal limits  I-STAT CHEM 8, ED - Abnormal; Notable for the following components:   Potassium 3.2 (*)    Creatinine, Ser 1.40 (*)    Glucose, Bld 116 (*)    Calcium, Ion 1.01 (*)    Hemoglobin 15.6 (*)    All other components within normal limits  URINE CULTURE  PROTIME-INR  APTT  TROPONIN I  TROPONIN I  TROPONIN I  CBC  COMPREHENSIVE METABOLIC PANEL  CK  HEMOGLOBIN A1C  LIPID PANEL  CBG MONITORING, ED   ____________________________________________  EKG   EKG Interpretation  Date/Time:  Saturday September 16 2018 17:50:28 EST Ventricular Rate:  107 PR Interval:    QRS Duration: 80 QT Interval:  336 QTC Calculation: 448 R Axis:   12 Text Interpretation:  Sinus tachycardia Minimal voltage criteria for LVH, may be normal variant Nonspecific ST and T wave abnormality Abnormal ECG No STEMI.  Confirmed by Alona BeneLong, Joshua [phone]([id number]) on 09/16/2018 7:57:53 PM         ____________________________________________  RADIOLOGY  Dg Chest 2 View  Result Date: 09/16/2018 CLINICAL DATA:  Altered mental status. EXAM: CHEST - 2 VIEW COMPARISON:  02/18/2017 FINDINGS: The cardiac silhouette is normal in size and configuration. No mediastinal or hilar masses. No evidence of adenopathy. Clear lungs.  No pleural effusion or pneumothorax. Skeletal structures are demineralized but intact. IMPRESSION: No active cardiopulmonary disease. Electronically Signed   By: Amie Portlandavid  Ormond M.D.   On: 09/16/2018 19:18   Ct Head Wo Contrast  Result Date: 09/16/2018 CLINICAL DATA:  Patient found on the floor assisted living home.  EXAM: CT HEAD WITHOUT CONTRAST TECHNIQUE: Contiguous axial images were obtained from the base of the skull through the vertex without intravenous contrast. COMPARISON:  11/08/2013 FINDINGS: Brain: No evidence of acute infarction, hemorrhage, hydrocephalus, extra-axial collection or mass lesion/mass effect. There is ventricular and sulcal enlargement reflecting age-appropriate volume loss. Patchy white matter hypoattenuation is also noted consistent with moderate chronic microvascular ischemic change. Vascular: No hyperdense vessel or unexpected calcification. Skull: Normal. Negative for fracture or focal lesion. Sinuses/Orbits: Globes and orbits are unremarkable. Visualized sinuses and mastoid air cells are clear. Other: None. IMPRESSION: 1. No acute intracranial abnormalities. 2. Age-appropriate volume loss. Moderate chronic microvascular ischemic change. Electronically Signed   By: Amie Portland M.D.   On: 09/16/2018 19:44    ____________________________________________   PROCEDURES  Procedure(s) performed:   Procedures  None ____________________________________________   INITIAL IMPRESSION / ASSESSMENT AND PLAN / ED COURSE  Pertinent labs & imaging results that were available during my care of the patient were reviewed by me and considered in my  medical decision making (see chart for details).  Patient presents to the emergency department for evaluation after being found down on the ground at her independent living community.  Unknown downtime but patient has no focal neurological deficits.  Family feels like her speech is different, however.  She appears reasonably well-hydrated.  I do not appreciate any skin lesions to make me think she has been on the ground for many days.  Not experiencing any chest pain.  Her EKG shows no acute ischemic findings.  She does have some acute kidney injury with elevated CK and elevated troponin.  My clinical suspicion for primary ACS is very low. More likely secondary to other secondary cause and prolonged down time. Will trend troponin as inpatient. No infection source. CT without acute abnormality. No SOB or hypoxemia to suspect PNA. Very low PE suspicion without active symptoms.   Discussed patient's case with Internal Medicine to request admission. Patient and family (if present) updated with plan. Care transferred to Medicine service.  I reviewed all nursing notes, vitals, pertinent old records, EKGs, labs, imaging (as available). ____________________________________________  FINAL CLINICAL IMPRESSION(S) / ED DIAGNOSES  Final diagnoses:  Weakness  Right hip pain     MEDICATIONS GIVEN DURING THIS VISIT:  Medications  lactated ringers infusion (has no administration in time range)   stroke: mapping our early stages of recovery book (has no administration in time range)  acetaminophen (TYLENOL) tablet 650 mg (has no administration in time range)    Or  acetaminophen (TYLENOL) solution 650 mg (has no administration in time range)    Or  acetaminophen (TYLENOL) suppository 650 mg (has no administration in time range)  senna-docusate (Senokot-S) tablet 1 tablet (has no administration in time range)  enoxaparin (LOVENOX) injection 30 mg (has no administration in time range)  aspirin chewable  tablet 324 mg (has no administration in time range)  aspirin EC tablet 325 mg (has no administration in time range)  sodium chloride 0.9 % bolus 1,000 mL (0 mLs Intravenous Stopped 09/16/18 2137)    Note:  This document was prepared using Dragon voice recognition software and may include unintentional dictation errors.  Alona Bene, MD Emergency Medicine    Long, Arlyss Repress, MD 09/16/18 2156

## 2018-09-16 NOTE — ED Notes (Signed)
Passed stroke swallow screen. Given [place] sandwich and PO fluids.

## 2018-09-16 NOTE — H&P (Addendum)
Date: 09/16/2018               Patient Name:  Lynn DashBetty S Mcintosh MRN: [id number]  DOB: September 29, 1920 Age / Sex: 85 y.o., female   PCP: Kirt Boysarter, Monica, DO         Medical Service: Internal Medicine Teaching Service         Attending Physician: Dr. Burns SpainButcher, Lynn A, MD    First Contact: Dr. Jaynie Breamorrell, Lynn Mcintosh Pager: [phone]  Second Contact: Dr. Levora DredgeHelberg, Lynn Pager: [phone]       After Hours (After 5p/  First Contact Pager: [phone]  weekends / holidays): Second Contact Pager: [phone]   Chief Complaint: Fall  History of Present Illness:  Lynn Mcintosh is a 85 yo F w/ Dementia, HTN, HLD, Osteoarthritis presenting from independent living facility after found down in her room. She was examined and evaluated in ED with granddaughter and grandson-in-law present. She is AAOx2 to self and location. She states she 'feels fine and is perfectly healthy' but is speaking with slurred speech. She is unable to recall any events leading up to her admission. Her granddaughter provides additional history. She mentions that Lynn Mcintosh was found down in her room with urinary incontinence by her care taker who visits every other day. Care taker informed the family and family members visited her at her home and found significant facial droop with dysarthria as well as significant weakness and called EMS to bring her to the ED.  She asked the facility staff and they mentioned that she missed both her lunch and dinner yesterday and no one else had checked up on her all day yesterday.   The granddaughter mentions that Lynn Mcintosh will usually deny any complaints despite symptoms and has not seen a doctor for more than a year and has declined treatment for her dementia in the past. However as far as she knows, she does not have any other significant medical history besides chronic low back pain due to her arthritis.  On review of systems, she denies any fevers, chills, syncopal episodes, chest pain, palpitations, dyspnea,  cough, dyspnea, nausea, vomiting, diarrhea, constipation, blurry vision, numbness, tingling, focal weakness, dysuria, urinary urgency, urinary frequency.  In the ED, she was given 1L NS. found to have troponemia and elevated CK. EKG showed no ischemic changes. IM was consulted for admission.  Meds:  Per granddaughter, she takes absolutely NO medications  Allergies: Allergies as of 09/16/2018  . (No Known Allergies)   Past Medical History:  Diagnosis Date  . Diverticulitis   . Hyperlipemia   . Hypertension   . Macular degeneration   . OA (osteoarthritis)     Family History:  Family history is significant for son who had a stroke. Unable to provide any other significant family history.  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 Currently living in independent living facility. Has an aide that visits every other day. Able to ambulate with walker and cane. Performs most ADLs on her own. IADLs performed with aid of visiting care taker and family members. POA is his son who had a recent stroke with residual dysarthria but has good mentation.  Review of Systems: A complete ROS was negative except as per HPI.   Physical Exam: Blood pressure (!) 114/46, pulse (!) 143, resp. rate 18, SpO2 (!) 87 %. Physical Exam  Constitutional: She is well-developed, well-nourished, and in no distress. No distress.  HENT:  Head: Normocephalic.  Mouth/Throat: Oropharynx is clear and moist. No oropharyngeal exudate.  Nasal ecchymosis in shape of her glasses  Eyes: Conjunctivae are normal. No scleral icterus.  Neck: Normal range of motion. Neck supple. No JVD present.  Cardiovascular: Normal rate and regular rhythm.  Murmur (3/6 systolic murmur ) heard. Pulmonary/Chest: Effort normal and breath sounds normal. She has no wheezes. She has no rales.  Abdominal: Soft. Bowel sounds are normal. She exhibits no distension. There is no abdominal tenderness.  Musculoskeletal:        General: Deformity  (hammer toe on left 2nd toe) present. No edema.     Comments: Unable to perform right hip flexion  Lymphadenopathy:    She has no cervical adenopathy.  Neurological:  Neurologic exam: Mental status: A&Ox2 to name and location but not date (thought 2014) Cranial Nerves: II: PERRL III, IV, VI: Extra-occular motions intact bilaterally V, VII: Right sided facial droop, sensation intact in all 3 divisions  VIII: hearing normal to rubbing fingers bilaterally  IX, X: palate rises symmetrically XI: Head turn and shoulder shrug normal bilaterally  XII: tongue midline  Motor: Strength 5/5 RUE, 4/5 LUE, 5/5 RLE (except for hip flexion), 5/5 LLE, bulk muscle and tone are normal   Gait:Unable to perform Sensory: Light touch intact and symmetric bilaterally  Psychiatric: Normal mood and affect  Skin: Skin is warm and dry. She is not diaphoretic.  Poor skin turgor   EKG: personally reviewed my interpretation is normal sinus, normal axis, no ST elevation or depression, no T wave inversions  CXR: personally reviewed my interpretation is rotated, no lobar consolidation, no pleural effusion, no vascular congestion  Assessment & Plan by Problem: Active Problems:   Rhabdomyolysis  Lynn Mcintosh is a 85 yo F w/ PMH of Dementia, HTN, HLD, and OA presenting after fall. Her presenting neurological symptoms are concerning for CVA. CT head is negative but pending additional imaging. She also has elevated troponins but is not endorsing chest pain and may be due to demand in acute stressor and no obvious ischemic changes on EKG. Her elevated CK is expected with history of prolonged duration on the ground after fall. Will admit for hydration and work up for other etiologies for her fall.  Fall 2/2 CVA vs seizure vs ACS vs mechanical fall Unclear if syncope preceding fall. R facial droop and dysarthria on exam. No hx of seizure  disorder. Has mild dementia. Troponin 1.11 on admission. EKG no ischemic changes. CT head negative for bleed. 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 Based on 2nd hand account may have fallen as early as night of 09/14/17. CK on admit 982 UA show + hgb, 0-5 rbcs - Trend CK - LR 125cc/hr  Hypokalemia and Ketouria 2/2 poor oral intake UA show 20 ketones. Per family, suspect no meals since 2 days ago. - K-dur once - Allow oral replenishment after speech and swallow  Acute on Chronic Kidney Disease 2/2 dehydration Baseline creatinine ~1.3 Admit creatinine 1.4 Per family, suspect no fluid intake since 2 days ago - Trend BMP - LR 125cc/hr  DVT prophx: Lovenox Diet: Regular Bowel: Senokot Code: DNR  Dispo: Admit patient to Inpatient with expected length of stay greater than 2 midnights.  Signed:  Theotis Barrio, MD 09/16/2018, 10:35 PM  Pager: [phone]

## 2018-09-17 ENCOUNTER — Inpatient Hospital Stay (HOSPITAL_COMMUNITY): Payer: Medicare Other

## 2018-09-17 DIAGNOSIS — I6523 Occlusion and stenosis of bilateral carotid arteries: Secondary | ICD-10-CM

## 2018-09-17 DIAGNOSIS — M6282 Rhabdomyolysis: Secondary | ICD-10-CM

## 2018-09-17 DIAGNOSIS — R824 Acetonuria: Secondary | ICD-10-CM

## 2018-09-17 DIAGNOSIS — I4891 Unspecified atrial fibrillation: Secondary | ICD-10-CM

## 2018-09-17 DIAGNOSIS — I10 Essential (primary) hypertension: Secondary | ICD-10-CM

## 2018-09-17 DIAGNOSIS — E785 Hyperlipidemia, unspecified: Secondary | ICD-10-CM

## 2018-09-17 DIAGNOSIS — I639 Cerebral infarction, unspecified: Secondary | ICD-10-CM

## 2018-09-17 DIAGNOSIS — M2042 Other hammer toe(s) (acquired), left foot: Secondary | ICD-10-CM

## 2018-09-17 DIAGNOSIS — M1990 Unspecified osteoarthritis, unspecified site: Secondary | ICD-10-CM

## 2018-09-17 DIAGNOSIS — R7989 Other specified abnormal findings of blood chemistry: Secondary | ICD-10-CM

## 2018-09-17 DIAGNOSIS — E876 Hypokalemia: Secondary | ICD-10-CM

## 2018-09-17 DIAGNOSIS — I63343 Cerebral infarction due to thrombosis of bilateral cerebellar arteries: Principal | ICD-10-CM

## 2018-09-17 DIAGNOSIS — R4781 Slurred speech: Secondary | ICD-10-CM

## 2018-09-17 DIAGNOSIS — Z66 Do not resuscitate: Secondary | ICD-10-CM

## 2018-09-17 DIAGNOSIS — R32 Unspecified urinary incontinence: Secondary | ICD-10-CM

## 2018-09-17 DIAGNOSIS — F0390 Unspecified dementia without behavioral disturbance: Secondary | ICD-10-CM

## 2018-09-17 DIAGNOSIS — R011 Cardiac murmur, unspecified: Secondary | ICD-10-CM

## 2018-09-17 DIAGNOSIS — R29810 Facial weakness: Secondary | ICD-10-CM

## 2018-09-17 DIAGNOSIS — N179 Acute kidney failure, unspecified: Secondary | ICD-10-CM

## 2018-09-17 DIAGNOSIS — R471 Dysarthria and anarthria: Secondary | ICD-10-CM

## 2018-09-17 LAB — URINE CULTURE: Culture: NO GROWTH

## 2018-09-17 LAB — TROPONIN I
Troponin I: 0.89 ng/mL (ref <0.03)
Troponin I: 0.92 ng/mL (ref <0.03)

## 2018-09-17 LAB — HEMOGLOBIN A1C
Hgb A1c MFr Bld: 4.9 % (ref 4.8–5.6)
Mean Plasma Glucose: 93.93 mg/dL

## 2018-09-17 LAB — LIPID PANEL
Cholesterol: 220 mg/dL — ABNORMAL HIGH (ref 0–200)
HDL: 48 mg/dL (ref >40)
LDL Cholesterol: 153 mg/dL — ABNORMAL HIGH (ref 0–99)
Total CHOL/HDL Ratio: 4.6 RATIO
Triglycerides: 96 mg/dL (ref <150)
VLDL: 19 mg/dL (ref 0–40)

## 2018-09-17 NOTE — Progress Notes (Signed)
Granddaughter approached nurse with concern that patient had not voided, possibly for greater than 24 hours. She came to the unit in a wet depend and also voided just prior to granddaughter coming out. We performed a bladder scan and recorded a post-void residual of 250cc. The patient is now on a purewick to measure as much as possible. MD paged.

## 2018-09-17 NOTE — ED Notes (Signed)
Patient transported to MRI 

## 2018-09-17 NOTE — Progress Notes (Signed)
   Subjective: Patient admitted after night with altered mental status and focal neural deficits. MRI illustrating multiple scattered acute ischemic not hemorrhagic infarcts and the bilateral cerebral and cerebellar hemispheres. Likely indicative of a cardio embolic etiology. She was subsequently admitted for further evaluation and management.  This morning the patient is doing well but cannot recall why she came in. She does not remember being found out on the floor and says that her last fall was several months ago. She otherwise has no complaints. We discussed the results of her MRI and she has given me permission to relate these results with her family. All questions and concerns addressed.  Objective: Vital signs in last 24 hours: Vitals:   09/17/18 0800 09/17/18 0946 09/17/18 0949 09/17/18 1000  BP: 117/60  122/65 122/73  Pulse: 80 62 78 79  Resp: (!) 26 13 (!) 21 18  SpO2: 98% 99% 97% 98%   General: Elderly female in no acute distress Pulm: Good air movement with no wheezing or crackles  CV: RRR, + systolic and diastolic murmurs Abdomen: Active bowel sounds, soft, non-distended, no tenderness to palpation  Extremities: Pulses palpable in all extremities, no LE edema  Neuro: Alert and oriented to person but not place or time. Cranial nerves 2-12 appear intact bilaterally. Gross strength 4/5 in the bilateral upper extremities. Gross strength 2/5 in the right lower and 3/5 in the left lower extremity  Assessment/Plan:  Mrs.Falconi is a 85 yo F w/ PMH of Dementia, HTN, HLD, and OA presenting after fall and focal neuro deficits. MRI illustrating multiple scattered acute ischemic not hemorrhagic infarcts and the bilateral cerebral and cerebellar hemispheres. Likely indicative of a cardio embolic etiology. She was subsequently admitted for further evaluation and management.  CVA - MRI illustrating multiple scattered acute ischemic not hemorrhagic infarcts and the bilateral cerebral and  cerebellar hemispheres. Likely indicative of a cardio embolic etiology. - MRA without vascular etiology  - Echocardiogram pending  - Allow for permissive HTN (systolic <220 and diastolic <120) - ASA 325 mg - LDL elevated to 153, would be indicative for need for statin therapy but will clarify GOC with family before starting medication  - Telemetry reviewed. 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  Levora Dredge, MD 09/17/2018, 11:13 AM

## 2018-09-17 NOTE — ED Notes (Signed)
PT transported to MRI dept

## 2018-09-17 NOTE — Progress Notes (Signed)
PT Cancellation Note  Patient Details Name: Lynn Mcintosh MRN: [id number] DOB: 08-18-1921   Cancelled Treatment:    Reason Eval/Treat Not Completed: Medical issues which prohibited therapy (elevated troponin, trending up).  Laurina Bustle, PT, DPT Acute Rehabilitation Services Pager [phone] Office [phone]    Lynn Mcintosh 09/17/2018, 7:52 AM

## 2018-09-17 NOTE — ED Notes (Signed)
BFAST TRAY ORDERED 

## 2018-09-17 NOTE — ED Notes (Signed)
Ordered diet tray for pt  

## 2018-09-17 NOTE — Progress Notes (Signed)
Presented to bedside to talk to Pryor Ochoa, the patient's daughter-in-law. We discussed the patient's presentation and MRI findings explore. We discussed that we are pursuing an echocardiogram to determine the source of her embolic strokes. We also discussed medications decrease the risk of this occurring again. The patient is currently saying that she would like all medications that we recommend. The patient's daughter-in-law states that she will talk to the patient more to determine what the patient really wants. We discussed that we will hold off on further discussions until we obtain to the echocardiogram. We will hold off on a neurology consult at this time.

## 2018-09-18 ENCOUNTER — Other Ambulatory Visit: Payer: Self-pay

## 2018-09-18 ENCOUNTER — Inpatient Hospital Stay (HOSPITAL_COMMUNITY): Payer: Medicare Other

## 2018-09-18 DIAGNOSIS — I63543 Cerebral infarction due to unspecified occlusion or stenosis of bilateral cerebellar arteries: Secondary | ICD-10-CM

## 2018-09-18 DIAGNOSIS — I491 Atrial premature depolarization: Secondary | ICD-10-CM

## 2018-09-18 DIAGNOSIS — G8191 Hemiplegia, unspecified affecting right dominant side: Secondary | ICD-10-CM

## 2018-09-18 DIAGNOSIS — N289 Disorder of kidney and ureter, unspecified: Secondary | ICD-10-CM

## 2018-09-18 DIAGNOSIS — I10 Essential (primary) hypertension: Secondary | ICD-10-CM

## 2018-09-18 DIAGNOSIS — Z7982 Long term (current) use of aspirin: Secondary | ICD-10-CM

## 2018-09-18 LAB — TROPONIN I: Troponin I: 0.32 ng/mL (ref <0.03)

## 2018-09-18 LAB — COMPREHENSIVE METABOLIC PANEL
ALT: 17 U/L (ref 0–44)
AST: 42 U/L — ABNORMAL HIGH (ref 15–41)
Albumin: 3 g/dL — ABNORMAL LOW (ref 3.5–5.0)
Alkaline Phosphatase: 60 U/L (ref 38–126)
Anion gap: 10 (ref 5–15)
BUN: 22 mg/dL (ref 8–23)
CO2: 20 mmol/L — ABNORMAL LOW (ref 22–32)
Calcium: 8.4 mg/dL — ABNORMAL LOW (ref 8.9–10.3)
Chloride: 109 mmol/L (ref 98–111)
Creatinine, Ser: 1.61 mg/dL — ABNORMAL HIGH (ref 0.44–1.00)
GFR calc Af Amer: 31 mL/min — ABNORMAL LOW (ref >60)
GFR calc non Af Amer: 27 mL/min — ABNORMAL LOW (ref >60)
Glucose, Bld: 132 mg/dL — ABNORMAL HIGH (ref 70–99)
Potassium: 3.2 mmol/L — ABNORMAL LOW (ref 3.5–5.1)
Sodium: 139 mmol/L (ref 135–145)
Total Bilirubin: 0.6 mg/dL (ref 0.3–1.2)
Total Protein: 5.4 g/dL — ABNORMAL LOW (ref 6.5–8.1)

## 2018-09-18 LAB — CK: Total CK: 322 U/L — ABNORMAL HIGH (ref 38–234)

## 2018-09-18 LAB — CBC
HCT: 38.1 % (ref 36.0–46.0)
Hemoglobin: 12.1 g/dL (ref 12.0–15.0)
MCH: 30.3 pg (ref 26.0–34.0)
MCHC: 31.8 g/dL (ref 30.0–36.0)
MCV: 95.3 fL (ref 80.0–100.0)
Platelets: 230 10*3/uL (ref 150–400)
RBC: 4 MIL/uL (ref 3.87–5.11)
RDW: 13.2 % (ref 11.5–15.5)
WBC: 6.4 10*3/uL (ref 4.0–10.5)
nRBC: 0 % (ref 0.0–0.2)

## 2018-09-18 LAB — ECHOCARDIOGRAM COMPLETE
Height: 64 in
Weight: 2419.77 oz

## 2018-09-18 MED ORDER — ENSURE ENLIVE PO LIQD
237.0000 mL | Freq: Two times a day (BID) | ORAL | Status: DC
  Administered 2018-09-18 – 2018-09-19 (×2): 237 mL via ORAL

## 2018-09-18 MED ORDER — POTASSIUM CHLORIDE CRYS ER 20 MEQ PO TBCR
40.0000 meq | EXTENDED_RELEASE_TABLET | Freq: Two times a day (BID) | ORAL | Status: AC
  Administered 2018-09-18 (×2): 40 meq via ORAL
  Filled 2018-09-18 (×2): qty 2

## 2018-09-18 NOTE — Progress Notes (Signed)
Spoke with the patient's daughter-in-law at bedside about medical management of her CVA. The daughter-in-law reports that Lynn Mcintosh was on a statin previously, but it made her "ache all over" so she stopped taking it. Therefore, the daughter-in-law would prefer that we do not start that at this time. We discussed the risks and benefits of anticoagulation with Eliquis given the likely cardioembolic etiology of her stroke. The patient's healthcare power of attorney is currently her son. He had a stroke recently that has rendered him aphasic. Therefore, he won't be able to communicate his wishes. The patient has a sister who is in a nursing home in a different state. She has her own cognitive issues and cannot help make medical decisions. Therefore, medical decisions should be made with the patient's granddaughter, Lynn Mcintosh. As of now, will hold off on anticoagulation until the topic can be discussed with [facility]. I also placed a palliative care consult to help guide goals of care for [facility] moving forward.

## 2018-09-18 NOTE — Progress Notes (Signed)
Initial Nutrition Assessment  DOCUMENTATION CODES:   Not applicable  INTERVENTION:   - Ordered Ensure Enlive BID at breakfast and lunch (each provides 350 kcal, 20 g protein)   NUTRITION DIAGNOSIS:   Inadequate oral intake related to decreased appetite, lethargy/confusion (dementia) as evidenced by estimated needs, per patient/family report.  GOAL:   Patient will meet greater than or equal to 90% of their needs  MONITOR:   PO intake, Supplement acceptance, Weight trends, Labs  REASON FOR ASSESSMENT:   Malnutrition Screening Tool    ASSESSMENT:   85 yo female, admitted with CVA. PMH significant for dementia, HTN, HLD. Resides in independent living facility.  Labs: cholesterol 220, LDL 153, potassium 3.2, glucose 116, Creatinine 1.40, AST 66, GFR 21%/24%, Hgb 15.6 Meds: LR 125 mL/hr  Pt sitting up in bed, with caretaker present, about to eat lunch (chef salad) at time of visit.  Pt endorses poor appetite. Gradual 15-20# wt loss over past 1 year related to dementia, per caretaker. At independent living facility, pt typically misses breakfast, has a decent lunch, and usually does not have dinner. Does not follow any special diet.  Denies nausea or vomiting, diarrhea, or difficulty chewing or swallowing. Reports chronic constipation. Last BM at least 2 days PTA.  Encouraged pt to include protein-rich foods with all meals and snacks, and to eat those foods first if not feeling hungry. Does not recall trying Ensure, but is amenable to receiving some while admitted. Prefers chocolate.  NUTRITION - FOCUSED PHYSICAL EXAM: Deferred - pt eating lunch at time of visit. Will need at f/u visit.  Diet Order:  No meals documented Diet Order            Diet regular Room service appropriate? Yes; Fluid consistency: Thin  Diet effective now              EDUCATION NEEDS:   Not appropriate for education at this time  Skin:  Skin Assessment: Skin Integrity Issues: Skin Integrity  Issues:: Stage I Stage I: buttocks  Last BM:  PTA  Height:   Ht Readings from Last 1 Encounters:  09/17/18 5\' 4"  (1.626 m)    Weight:   Wt Readings from Last 1 Encounters:  09/18/18 68.6 kg    Ideal Body Weight:  54.5 kg  BMI:  Body mass index is 25.96 kg/m.  Estimated Nutritional Needs:   Kcal:  1395 calories daily (MSJ x 1.3)  Protein:  69-89 gm daily (1.0-1.3 g/kg ABW)  Fluid:  >/= 1.4 L daily or per MD discretion  Jolaine Artist, MS, RDN, LDN Pager: [phone] Available Mondays and Fridays, 9am-2pm

## 2018-09-18 NOTE — Progress Notes (Signed)
Medicine attending: I examined this patient today together with resident physician Dr Vilma Prader and I concur with her evaluation and management plan.  85 year old woman found on the floor in her nursing facility.  Incontinent of urine.  Family noted she was dysarthric and had an right facial droop.  CT brain with microvascular changes but no acute infarct.  However, MRI brain with multiple scattered predominantly subcentimeter acute ischemic nonhemorrhagic infarcts involving bilateral cerebral and cerebellar hemispheres as well as the brainstem.  No large vessel occlusion on MRI of the neck. No prior history of cardiac disease or arrhythmias.  Current EKG with normal sinus rhythm. No history of hypertension and blood pressures are actually low: 116/51 on presentation, 137/91 today. Impression is embolic stroke. Her focal deficits have resolved.  Speech is fluent.  She is very alert and interactive.  Despite recorded history of "dementia" she is very appropriate.  Formal mental status testing not done. She was started on antiplatelet agent. Dr. Annie Paras met with her daughter-in-law for further discussion with regard to optimal management which in this case would be anticoagulation in preference to an antiplatelet agent.  There are discrepant creatinine values in the chart and there may be an element of dehydration.  Current calculated GFR is borderline with respect to the use of a non-warfarin oral anticoagulant.  If renal function stabilizes or improves, the most user-friendly anticoagulant and the safest for somebody with some renal compromise would be Eliquis and I would dose reduce in view of her age and renal dysfunction and treat with 2.5 mg twice daily. Final decision on anticoagulation pending further discussion with other decision makers and her family.  In my opinion, benefits of anticoagulation far outweigh the risks in this situation.

## 2018-09-18 NOTE — Progress Notes (Signed)
Pt refused morning labs. Pt said, "Who ordered these labs and why am I getting them." RN tried to educate pt on lab draws and pt still refused saying, "Why am I the last to know about these labs and where am I? RN was unable to reorient pt and pt refused to allow lab tech to draw morning labs.

## 2018-09-18 NOTE — Progress Notes (Signed)
   Subjective: No overnight events. [name] declined her morning labs this morning and was confused as to her location per nursing. This morning, she is laying comfortably in bed. She is oriented to person, but not to place, and appears surprised that she is in the hospital. She is amenable to getting her blood drawn. She does not remember the events leading up to her hospitalization, but she is aware that she was found on the floor and that she had a stroke. She reports that her right leg continues to feel weaker, but she otherwise feels well.   Objective:  Vital signs in last 24 hours: Vitals:   09/17/18 1522 09/17/18 1620 09/17/18 2254 09/18/18 0307  BP: (!) 104/48 (!) 103/59 134/75   Pulse: 84 81 76   Resp:  18 18   Temp:  97.7 F (36.5 C) 98.5 F (36.9 C)   TempSrc:  Oral Oral   SpO2:   96%   Weight:    68.6 kg  Height:   5\' 4"  (1.626 m)    General: Elderly female in no acute distress HENT: Ecchymosis over the nasal bridge CV: RRR, systolic murmur present. Abdomen: Active bowel sounds, soft, non-distended, no tenderness to palpation  Extremities: Ecchymosis of the left wrist. No LE edema  Neuro: Alert and oriented to person but not place. Cranial nerves 2-12 appear intact bilaterally. No facial droop noted. Gross strength 4/5 in the bilateral upper extremities. Gross strength 3/5 in the right lower and 4/5 in the left lower extremity.  Assessment/Plan:  Principal Problem:   CVA (cerebral vascular accident) (HCC) Active Problems:   Rhabdomyolysis   HLD (hyperlipidemia)   HTN (hypertension)  [name] is a 85 yo female with a medical history of dementia, HTN, HLD, and OA who was admitted after a fall, prolonged immobilization after the fall for an unknown period of time, and focal neuro deficits including right lower facial droop, dysarthria, and 2/5 RLE weakness. MRI illustrated multiple scattered acute ischemic infarcts in the bilateral cerebral and cerebellar hemispheres,  likely indicative of a cardio embolic etiology.   CVA - Likely cardioembolic. Telemetry shows PACs, but no afib or aflutter. However, she would likely benefit from anticoagulation given her good functional status. Will speak with the patient and her family about anticoagulation and a preferred agent. She would also benefit from a statin.  Plan - ECHO pending - PT/OT - Continue aspirin for now - Discuss anticoagulation and statin initiation with family  Elevated Troponin - Troponin trend 0.89 -> 0.92  - No chest pain. No ischemic EKG changes Plan - Repeat troponin  Rhabdomyolysis 2/2 prolonged immobilization after fall - Patient declined morning labs, but is amenable now. Plan - BMP to check kidney function - Repeat CK - Continue hydration with LR.  Dispo: Anticipated discharge pending PT eval.  Dorrell, Cathleen Cortieborah N, MD 09/18/2018, 6:05 AM Pager: [phone]

## 2018-09-18 NOTE — Evaluation (Signed)
Occupational Therapy Evaluation Patient Details Name: Lynn Mcintosh MRN: [id number] DOB: 1921/04/20 Today's Date: 09/18/2018    History of Present Illness 85yo female found down at her ALF, found to have facial droop, dysarthria, and weakness. 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 PMH HTN, dementia, macular degeneration    Clinical Impression   Patient presenting with decreased I in self care, balance, functional mobility/transfer, safety awareness, endurance, and strengthening. Patient living in ILF  PTA. Patient currently functioning at min - max A for self care and use of stedy for functional transfer and sit <>stand. Patient will benefit from acute OT to increase overall independence in the areas of ADLs, functional mobility, and safety in order to safely discharge to next venue of care.    Follow Up Recommendations  SNF    Equipment Recommendations  Other (comment)(defer to next venue of care)    Recommendations for Other Services (none at this time)     Precautions / Restrictions Precautions Precautions: Fall Restrictions Weight Bearing Restrictions: No      Mobility Bed Mobility Overal bed mobility: Needs Assistance Bed Mobility: Supine to Sit;Sit to Supine     Supine to sit: Mod assist     General bed mobility comments: min A for trunk and pt able to bring LEs to EOB with mod cuing   Transfers Overall transfer level: Needs assistance   Transfers: Sit to/from Stand;Stand Pivot Transfers Sit to Stand: Total assist Stand pivot transfers: Total assist       General transfer comment: pt standing into stedy with mod A and transferred into recliner chair     Balance Overall balance assessment: Needs assistance Sitting-balance support: Bilateral upper extremity supported;Feet supported Sitting balance-Leahy Scale: Fair       Standing balance-Leahy Scale: Poor Standing balance comment: holding onto STEDY        ADL either performed  or assessed with clinical judgement   ADL Overall ADL's : Needs assistance/impaired     Grooming: Wash/dry hands;Wash/dry face;Oral care;Brushing hair;Minimal assistance   Upper Body Bathing: Minimal assistance;Sitting   Lower Body Bathing: Maximal assistance;Sit to/from stand   Upper Body Dressing : Minimal assistance;Sitting   Lower Body Dressing: Maximal assistance          Vision Baseline Vision/History: Wears glasses Wears Glasses: At all times Patient Visual Report: No change from baseline              Pertinent Vitals/Pain Pain Assessment: No/denies pain     Hand Dominance Right   Extremity/Trunk Assessment Upper Extremity Assessment Upper Extremity Assessment: Generalized weakness   Lower Extremity Assessment Lower Extremity Assessment: Generalized weakness   Cervical / Trunk Assessment Cervical / Trunk Assessment: Kyphotic   Communication Communication Communication: HOH   Cognition Arousal/Alertness: Awake/alert Behavior During Therapy: WFL for tasks assessed/performed Overall Cognitive Status: Impaired/Different from baseline Area of Impairment: Orientation;Safety/judgement;Attention;Memory    Orientation Level: Disoriented to;Situation;Place;Time Current Attention Level: Selective Memory: Decreased short-term memory   Safety/Judgement: Decreased awareness of deficits                    Home Living Family/patient expects to be discharged to:: Skilled nursing facility     Type of Home: Independent living facility        Additional Comments: lives in ILF and per daughter was able to perform all activities/mobility without difficulty and RW, unsure of falls as facility did not report any but has had some bruising. Had aides but family unsure how much  they were helping.       Prior Functioning/Environment Level of Independence: Independent with assistive device(s)                 OT Problem List: Decreased strength;Impaired  balance (sitting and/or standing);Decreased cognition;Pain;Decreased safety awareness;Decreased activity tolerance;Decreased coordination;Decreased knowledge of use of DME or AE      OT Treatment/Interventions: Self-care/ADL training;Manual therapy;Visual/perceptual remediation/compensation;Therapeutic exercise;Patient/family education;Balance training;Energy conservation;Therapeutic activities;Cognitive remediation/compensation;DME and/or AE instruction;Neuromuscular education    OT Goals(Current goals can be found in the care plan section) Acute Rehab OT Goals Patient Stated Goal: "feel better and stronger" OT Goal Formulation: With patient/family Time For Goal Achievement: 10/02/18 Potential to Achieve Goals: Good ADL Goals Pt Will Perform Grooming: with set-up Pt Will Perform Upper Body Bathing: with set-up Pt Will Perform Lower Body Bathing: with min assist Pt Will Perform Upper Body Dressing: with set-up Pt Will Perform Lower Body Dressing: with min assist Pt Will Transfer to Toilet: with min assist Pt Will Perform Toileting - Clothing Manipulation and hygiene: with min assist  OT Frequency: Min 2X/week   Barriers to D/C: Other (comment)  none known at this time          AM-PAC OT "6 Clicks" Daily Activity     Outcome Measure Help from another person eating meals?: None Help from another person taking care of personal grooming?: A Little Help from another person toileting, which includes using toliet, bedpan, or urinal?: A Lot Help from another person bathing (including washing, rinsing, drying)?: A Lot Help from another person to put on and taking off regular upper body clothing?: A Little Help from another person to put on and taking off regular lower body clothing?: A Lot 6 Click Score: 16   End of Session Equipment Utilized During Treatment: Other (comment)(stedy) Nurse Communication: Mobility status  Activity Tolerance: Patient tolerated treatment well Patient left:  in chair;with call bell/phone within reach;with family/visitor present  OT Visit Diagnosis: Repeated falls (R29.6);Muscle weakness (generalized) (M62.81);Cognitive communication deficit (R41.841) Symptoms and signs involving cognitive functions: Other Nontraumatic ICH                Time: 7035-0093 OT Time Calculation (min): 24 min Charges:  OT General Charges $OT Visit: 1 Visit OT Evaluation $OT Eval Low Complexity: 1 Low OT Treatments $Therapeutic Activity: 8-22 mins   Denilson Salminen P, MS, OTR/L 09/18/2018, 3:53 PM

## 2018-09-18 NOTE — Progress Notes (Signed)
  Echocardiogram 2D Echocardiogram has been performed.  Lynn Mcintosh Lynn Mcintosh 09/18/2018, 3:44 PM

## 2018-09-18 NOTE — Evaluation (Signed)
Physical Therapy Evaluation Patient Details Name: Lynn Mcintosh MRN: [id number] DOB: 27-Sep-1920 Today's Date: 09/18/2018   History of Present Illness  85yo female found down at her ALF, found to have facial droop, dysarthria, and weakness. 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 PMH HTN, dementia, macular degeneration   Clinical Impression   Received permission/request from Dr. Annie Paras to see patient even with up-trending/elevated troponins in Epic secure messenger. Patient received in bed with daughter-in-law present, who provided history and PLOF; she reports it is mostly her who will be taking care of patient right now as one son died in 2023-03-13 and the other had a large stroke in July. Patient requires ModA to come to EOB and close S/VC to maintain upright sitting balance at EOB; attempted sit to stand with TotalAx1, unable to clear buttocks from bed and will require +2 for safe progression of mobility, patient also very SOB with this activity so returned to supine with ModA and MaxA to reposition in bed. She was left in bed with all needs met, bed alarm active, and family present, RN aware of patient status and PT recommendations. She will continue to benefit from skilled PT services in the acute setting, strongly recommend ongoing skilled rehab in the SNF setting moving forward.     Follow Up Recommendations SNF    Equipment Recommendations  Other (comment)(defer to next venue )    Recommendations for Other Services       Precautions / Restrictions Precautions Precautions: Fall Restrictions Weight Bearing Restrictions: No      Mobility  Bed Mobility Overal bed mobility: Needs Assistance Bed Mobility: Supine to Sit;Sit to Supine     Supine to sit: Mod assist Sit to supine: Mod assist   General bed mobility comments: ModA to bring legs around and come to EOB; close S to maintain sitting balance at EOB. ModA for LE management for back to bed and MaxA to  reposition in bed   Transfers Overall transfer level: Needs assistance Equipment used: 1 person hand held assist Transfers: Sit to/from Stand Sit to Stand: Total assist         General transfer comment: attempted with assist +1, no initiation noted and unable to safely clear buttocks from bed with +1, will require +2 to safely progress mobility   Ambulation/Gait             General Gait Details: deferred   Stairs            Wheelchair Mobility    Modified Rankin (Stroke Patients Only)       Balance Overall balance assessment: Needs assistance Sitting-balance support: Bilateral upper extremity supported;Feet supported Sitting balance-Leahy Scale: Fair       Standing balance-Leahy Scale: Zero                               Pertinent Vitals/Pain Pain Assessment: No/denies pain    Home Living Family/patient expects to be discharged to:: Unsure                 Additional Comments: lives in ALF and per daughter was able to perform all activities/mobility without difficulty and RW, unsure of falls as facility did not report any but has had some bruising. Had aides but family unsure how much they were helping.     Prior Function Level of Independence: Independent with assistive device(s)  Hand Dominance        Extremity/Trunk Assessment   Upper Extremity Assessment Upper Extremity Assessment: Defer to OT evaluation    Lower Extremity Assessment Lower Extremity Assessment: Generalized weakness    Cervical / Trunk Assessment Cervical / Trunk Assessment: Kyphotic  Communication   Communication: HOH  Cognition Arousal/Alertness: Awake/alert Behavior During Therapy: WFL for tasks assessed/performed Overall Cognitive Status: History of cognitive impairments - at baseline                                 General Comments: A&Ox2, self and city       General Comments      Exercises      Assessment/Plan    PT Assessment Patient needs continued PT services  PT Problem List Decreased strength;Decreased balance;Decreased cognition;Decreased knowledge of precautions;Decreased mobility;Decreased activity tolerance;Decreased coordination;Decreased safety awareness       PT Treatment Interventions DME instruction;Functional mobility training;Balance training;Patient/family education;Gait training;Therapeutic activities;Neuromuscular re-education;Stair training;Therapeutic exercise;Cognitive remediation    PT Goals (Current goals can be found in the Care Plan section)  Acute Rehab PT Goals Patient Stated Goal: get stronger  PT Goal Formulation: With patient/family Time For Goal Achievement: 10/02/18 Potential to Achieve Goals: Fair    Frequency Min 2X/week   Barriers to discharge        Co-evaluation               AM-PAC PT "6 Clicks" Mobility  Outcome Measure Help needed turning from your back to your side while in a flat bed without using bedrails?: A Lot Help needed moving from lying on your back to sitting on the side of a flat bed without using bedrails?: A Lot Help needed moving to and from a bed to a chair (including a wheelchair)?: Total Help needed standing up from a chair using your arms (e.g., wheelchair or bedside chair)?: Total Help needed to walk in hospital room?: Total Help needed climbing 3-5 steps with a railing? : Total 6 Click Score: 8    End of Session   Activity Tolerance: Patient limited by fatigue Patient left: in bed;with bed alarm set;with call bell/phone within reach;with family/visitor present Nurse Communication: Mobility status PT Visit Diagnosis: Unsteadiness on feet (R26.81);Muscle weakness (generalized) (M62.81);Difficulty in walking, not elsewhere classified (R26.2);Other abnormalities of gait and mobility (R26.89)    Time: 4114-6431 PT Time Calculation (min) (ACUTE ONLY): 28 min   Charges:   PT Evaluation $PT Eval  Moderate Complexity: 1 Mod PT Treatments $Therapeutic Activity: 8-22 mins        Deniece Ree PT, DPT, CBIS  Supplemental Physical Therapist [place]    Pager [phone] Acute Rehab Office [phone]

## 2018-09-18 NOTE — Clinical Social Work Note (Signed)
Clinical Social Work Assessment  Patient Details  Name: Lynn Mcintosh MRN: [id number] Date of Birth: 1921-06-24  Date of referral:  09/18/18               Reason for consult:  Facility Placement                Permission sought to share information with:  Facility Industrial/product designer granted to share information::     Name::     Insurance underwriter::  [employer]  Relationship::  Daughter in Diplomatic Services operational officer Information:     Housing/Transportation Living arrangements for the past 2 months:  Independent Dealer of Information:  Adult Children Patient Interpreter Needed:  None Criminal Activity/Legal Involvement Pertinent to Current Situation/Hospitalization:  No - Comment as needed Significant Relationships:  Adult Children Lives with:  Self Do you feel safe going back to the place where you live?  No Need for family participation in patient care:  No (Coment)  Care giving concerns: Pt is only alert to self. CSW spoke with pt's daughter in law via telephone.   Social Worker assessment / plan:  CSW spoke with pt's daughter in Social worker. Pt lives at [facility] (ILF). Pt's daugther in law agrees pt will need SNF and prefers pt go to [facility]. Pt's daughter in law agreed to a list of SNF close to [place]. CSW to provide SNF list generated by Medicare.   Employment status:  Retired Database administrator PT Recommendations:  Not assessed at this time Information / Referral to community resources:  Skilled Nursing Facility  Patient/Family's Response to care: Pt's daughter in law verbalized understanding of CSW role and expressed appreciation for support. Pt's daughter in law denies any concern regarding pt care at this time.   Patient/Family's Understanding of and Emotional Response to Diagnosis, Current Treatment, and Prognosis:  Pt's daughter in law understanding of pt's physical limitations. Pt's daughter in law agrees pt will need to go  to SNF. Pt's daughter in law denies any further questions or concerns at this time.  Emotional Assessment Appearance:  Appears stated age Attitude/Demeanor/Rapport:  Unable to Assess Affect (typically observed):  Unable to Assess Orientation:  Oriented to Self Alcohol / Substance use:  Not Applicable Psych involvement (Current and /or in the community):  No (Comment)  Discharge Needs  Concerns to be addressed:  Care Coordination, Basic Needs Readmission within the last 30 days:  No Current discharge risk:  Dependent with Mobility Barriers to Discharge:  Continued Medical Work up   [employer], LCSW 09/18/2018, 11:47 AM

## 2018-09-18 NOTE — Discharge Summary (Signed)
Name: Lynn Mcintosh MRN: [id number] DOB: [birth year] 85 y.o. PCP: Kirt Boys, DO  Date of Admission: 09/16/2018  5:42 PM Date of Discharge: 10/10/2018 Attending Physician: Dr. Cyndie Chime  Discharge Diagnosis: 1. CVA, likely cardioembolic 2. Mitral valve regurgitation 3. Rhabdomyolysis with AKI  Discharge Medications: Allergies as of 10/04/2018   No Known Allergies     Medication List    TAKE these medications   aspirin 325 MG EC tablet Take 1 tablet (325 mg total) by mouth daily. Start taking on:  September 20, 2018      Disposition and follow-up:   LynnBlinda S Armenteros was discharged from [facility] in Good condition.  At the hospital follow up visit please address:  1. CVA - Likely cardioembolic, but no evidence of arrhythmias on telemetry and no intracardiac thrombi on TTE - Patient started on aspirin, but her family (including power of attorney) declined statin and anticoagulation  2. Mitral valve regurgitation - Found on ECHO - No signs of volume overload on physical exam - Patient likely not a good candidate for a valve procedure. - Should be monitored in the outpatient setting in conjunction with the family's wishes for medical intervention.  3. Rhabdomyolysis with AKI - Baseline Cr around 1.3. Elevated Cr to 1.98 on admission. Down-trended to 1.16 on discharge. - F/u BMP to trend Cr  4. Goals of care - Family declined palliative care consult during admission - Consider palliative care team discussion at SNF or in the outpatient setting  5.  Labs / imaging needed at time of follow-up: BMP  6.  Pending labs/ test needing follow-up: None  Follow-up Appointments: Contact information for after-discharge care    Destination    [facility] Preferred SNF .   Service:  Skilled Nursing Contact information: [address] [place] [address] [phone]              Hospital Course by problem list: 1. CVA:  Lynn Mcintosh is a 85 yo female with a medical history of dementia, HTN, HLD, and OA who was admitted after a fall, prolonged immobilization after the fall for an unknown period of time,and focal neuro deficits including right lower facial droop, dysarthria, and 2/5 RLE weakness.MRI illustrated multiple scattered acute ischemic infarcts in the bilateral cerebral and cerebellar hemispheres, likely indicative of a cardio embolic etiology. No evidence of arrhythmias on telemetry. No intracardiac thrombi on TTE. ECHO did show severe mitral valve regurgitation, but the patient is not clinically volume overloaded and is likely not a good candidate for valve replacement. Patient started on aspirin. Her family and healthcare power of attorney declined statin and anticoagulation after risks/benefits discussion. Discharged to SNF due to ongoing RLE weakness.  2. Rhabdomyolysis with AKI: Patient presented with CK of 900 and Cr of 2 (increased from 1.3 at baseline). Treated with IV hydration. Patient was tolerating PO well. CK trended down. Cr trended down to 1.16. Recheck BMP on f/u to reassess kidney function.  Discharge Vitals:   BP (!) 129/58   Pulse 79   Temp 98.1 F (36.7 C) (Oral)   Resp 16   Ht 5\' 4"  (1.626 m)   Wt 69 kg   SpO2 96%   BMI 26.11 kg/m   Pertinent Labs, Studies, and Procedures:  CBC Latest Ref Rng & Units 09/18/2018 09/16/2018 09/16/2018  WBC 4.0 - 10.5 K/uL 6.4 - 14.8(H)  Hemoglobin 12.0 - 15.0 g/dL 02.5 15.6(H) 14.9  Hematocrit 36.0 - 46.0 % 38.1  46.0 45.2  Platelets 150 - 400 K/uL 230 - 322   CMP Latest Ref Rng & Units 10/01/2018 09/18/2018 09/16/2018  Glucose 70 - 99 mg/dL 97 086(V132(H) 784(O116(H)  BUN 8 - 23 mg/dL 20 22 23   Creatinine 0.44 - 1.00 mg/dL 9.62(X1.16(H) 5.28(U1.61(H) 1.32(G1.40(H)  Sodium 135 - 145 mmol/L 139 139 136  Potassium 3.5 - 5.1 mmol/L 4.8 3.2(L) 3.2(L)  Chloride 98 - 111 mmol/L 112(H) 109 104  CO2 22 - 32 mmol/L 19(L) 20(L) -  Calcium 8.9 - 10.3 mg/dL 8.2(L) 8.4(L) -  Total Protein 6.5 -  8.1 g/dL - 5.4(L) -  Total Bilirubin 0.3 - 1.2 mg/dL - 0.6 -  Alkaline Phos 38 - 126 U/L - 60 -  AST 15 - 41 U/L - 42(H) -  ALT 0 - 44 U/L - 17 -   CT head 09/16/2018 1. No acute intracranial abnormalities. 2. Age-appropriate volume loss. Moderate chronic microvascular ischemic change.  MR brain 09/17/2018 1. Multiple scattered predominantly subcentimeter acute ischemic nonhemorrhagic infarcts involving the bilateral cerebral and cerebellar hemispheres as well as the brainstem. A central thromboembolic etiology is suspected. 2. Underlying age-related cerebral atrophy with moderate chronic small vessel ischemic disease.  MRA head and neck 09/17/2018 MRA neck: No internal carotid stenosis or irregularity on either side. Bilateral external carotid artery stenoses. No posterior circulation compromise evident. MRA head: Normal appearance of the large and medium size vessels.  Discharge Instructions: Discharge Instructions    Discharge instructions   Complete by:  As directed    It was a pleasure taking care of Lynn Mcintosh during her hospitalization.  1. She was admitted for a stroke with residual right sided weakness (worse in the lower extremity than the upper extremity). She will be discharged to SNF to regain her strength.  2. She was started on daily aspirin. Other medications were declined by her family as their goals of care are to intervene as little as possible with the patient's remaining time.  Please see the discharge summary for other details of hospitalization.  Please call our clinic at [phone] if you have any questions.  Thanks, Dr. Avie Arenasorrell   Increase activity slowly   Complete by:  As directed       Signed: , Cathleen Cortieborah N, MD 09/17/2018, 12:06 PM   Pager: [phone]

## 2018-09-18 NOTE — NC FL2 (Signed)
  [place] MEDICAID FL2 LEVEL OF CARE SCREENING TOOL     IDENTIFICATION  Patient Name: Lynn Mcintosh Birthdate: 03-18-1921 Sex: female Admission Date (Current Location): 09/16/2018  [facility] and [place] Number:  Producer, television/film/video and Address:  [facility], [address] [place], [place] [address]      Provider Number: [id number]  Attending Physician Name and Address:  Levert Feinstein, MD  Relative Name and Phone Number:       Current Level of Care: Hospital Recommended Level of Care: Skilled Nursing Facility Prior Approval Number:    Date Approved/Denied:   PASRR Number: [id number]  Discharge Plan: SNF    Current Diagnoses: Patient Active Problem List   Diagnosis Date Noted  . CVA (cerebral vascular accident) (HCC) 09/17/2018  . HLD (hyperlipidemia) 09/17/2018  . HTN (hypertension) 09/17/2018  . Rhabdomyolysis 09/16/2018  . Weight loss 02/11/2017  . Abnormal chest x-ray 02/11/2017  . Dementia without behavioral disturbance (HCC) 04/16/2016  . Macular degeneration 04/16/2016    Orientation RESPIRATION BLADDER Height & Weight     Self  Normal Continent Weight: 151 lb 3.8 oz (68.6 kg) Height:  5\' 4"  (162.6 cm)  BEHAVIORAL SYMPTOMS/MOOD NEUROLOGICAL BOWEL NUTRITION STATUS      Continent Diet(heart healthy/thin liquids)  AMBULATORY STATUS COMMUNICATION OF NEEDS Skin   Extensive Assist Verbally PU Stage and Appropriate Care PU Stage 1 Dressing: (Buttock, foam dressing, change PRN)                     Personal Care Assistance Level of Assistance  Bathing, Feeding, Dressing Bathing Assistance: Maximum assistance Feeding assistance: Independent Dressing Assistance: Maximum assistance     Functional Limitations Info  Sight, Hearing, Speech Sight Info: Adequate Hearing Info: Adequate Speech Info: Adequate    SPECIAL CARE FACTORS FREQUENCY  PT (By licensed PT), OT (By licensed OT)     PT Frequency: 2x OT Frequency: 2x             Contractures Contractures Info: Not present    Additional Factors Info  Code Status, Allergies Code Status Info: DNR Allergies Info: NO known allergies           Current Medications (09/18/2018):  This is the current hospital active medication list Current Facility-Administered Medications  Medication Dose Route Frequency Provider Last Rate Last Dose  . acetaminophen (TYLENOL) tablet 650 mg  650 mg Oral Q4H PRN Nyra Market, MD       Or  . acetaminophen (TYLENOL) solution 650 mg  650 mg Per Tube Q4H PRN Nyra Market, MD       Or  . acetaminophen (TYLENOL) suppository 650 mg  650 mg Rectal Q4H PRN Nyra Market, MD      . aspirin EC tablet 325 mg  325 mg Oral Daily Nyra Market, MD   325 mg at 09/18/18 0955  . enoxaparin (LOVENOX) injection 30 mg  30 mg Subcutaneous Daily Nyra Market, MD   30 mg at 09/18/18 0955  . lactated ringers infusion   Intravenous Continuous Nyra Market, MD 125 mL/hr at 09/18/18 7741    . senna-docusate (Senokot-S) tablet 1 tablet  1 tablet Oral QHS PRN Nyra Market, MD         Discharge Medications: Please see discharge summary for a list of discharge medications.  Relevant Imaging Results:  Relevant Lab Results:   Additional Information SSN: [id number]  Maree Krabbe, LCSW

## 2018-09-18 NOTE — Progress Notes (Addendum)
Attending resident/MD aware of elevated/up-trending troponins and request PT to evaluate patient regardless. PT eval pending.   Nedra Hai PT, DPT, CBIS  Supplemental Physical Therapist [facility]    Pager [phone] Acute Rehab Office [phone]

## 2018-09-18 NOTE — Progress Notes (Signed)
PT Cancellation Note  Patient Details Name: Lynn Mcintosh MRN: [id number] DOB: 1921-04-13   Cancelled Treatment:    Reason Eval/Treat Not Completed: Medical issues which prohibited therapy Continue to note elevated, up-trending troponins. Have made attempt to contact attending MD through Epic secure messenger in regards to continuing to hold vs potentially seeing patient.   Nedra Hai PT, DPT, CBIS  Supplemental Physical Therapist [facility]    Pager [phone] Acute Rehab Office [phone]

## 2018-09-18 NOTE — Evaluation (Signed)
Speech Language Pathology Evaluation Patient Details Name: Lynn Mcintosh MRN: [id number] DOB: 09/26/1920 Today's Date: 09/18/2018 Time: 5859-2924 SLP Time Calculation (min) (ACUTE ONLY): 19 min  Problem List:  Patient Active Problem List   Diagnosis Date Noted  . CVA (cerebral vascular accident) (HCC) 09/17/2018  . HLD (hyperlipidemia) 09/17/2018  . HTN (hypertension) 09/17/2018  . Rhabdomyolysis 09/16/2018  . Weight loss 02/11/2017  . Abnormal chest x-ray 02/11/2017  . Dementia without behavioral disturbance (HCC) 04/16/2016  . Macular degeneration 04/16/2016   Past Medical History:  Past Medical History:  Diagnosis Date  . Diverticulitis   . Hyperlipemia   . Hypertension   . Macular degeneration   . OA (osteoarthritis)    Past Surgical History:  Past Surgical History:  Procedure Laterality Date  . APPENDECTOMY    . TONSILLECTOMY AND ADENOIDECTOMY     HPI:  85yo female found down at her ALF, found to have facial droop, dysarthria, and weakness. 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 PMH HTN, dementia, macular degeneration    Assessment / Plan / Recommendation Clinical Impression  [name] describes a baseline of cognitive issues, primarily with memory, prior to admission. She describes an acute decline s/p acute CVA with imprecise articulation at the conversational level, orientation to person only, and impaired intelelctual awareness, unable to identify any cognitive, communicative, or physical changes. Mod cues were given for completion of one-step commands in trials. Recommend additional SLP f/u at SNF.    SLP Assessment  SLP Recommendation/Assessment: Patient needs continued Speech Lanaguage Pathology Services SLP Visit Diagnosis: Dysarthria and anarthria (R47.1);Cognitive communication deficit (R41.841)    Follow Up Recommendations  Skilled Nursing facility    Frequency and Duration min 2x/week  2 weeks      SLP  Evaluation Cognition  Overall Cognitive Status: Impaired/Different from baseline Arousal/Alertness: Awake/alert Orientation Level: Oriented to person;Disoriented to place;Disoriented to time;Disoriented to situation Memory: Impaired Memory Impairment: Decreased recall of new information Awareness: Impaired Awareness Impairment: Intellectual impairment Problem Solving: Impaired Problem Solving Impairment: Verbal basic Safety/Judgment: Impaired       Comprehension  Auditory Comprehension Overall Auditory Comprehension: Impaired Commands: Impaired One Step Basic Commands: 50-74% accurate    Expression Expression Primary Mode of Expression: Verbal Verbal Expression Overall Verbal Expression: Appears within functional limits for tasks assessed   Oral / Motor  Oral Motor/Sensory Function Overall Oral Motor/Sensory Function: Mild impairment Facial ROM: Reduced right;Suspected CN VII (facial) dysfunction Facial Symmetry: Abnormal symmetry right;Suspected CN VII (facial) dysfunction Facial Strength: Reduced right;Suspected CN VII (facial) dysfunction Motor Speech Overall Motor Speech: Impaired Respiration: Within functional limits Phonation: Normal Resonance: Within functional limits Articulation: Impaired Level of Impairment: Conversation Intelligibility: Intelligible   GO                    Maxcine Ham 09/18/2018, 1:44 PM  Maxcine Ham, M.A. CCC-SLP Acute Herbalist [phone] Office [phone]

## 2018-09-19 ENCOUNTER — Inpatient Hospital Stay
Admission: RE | Admit: 2018-09-19 | Discharge: 2020-12-12 | Disposition: E | Payer: Medicare Other | Source: Ambulatory Visit | Attending: Internal Medicine | Admitting: Internal Medicine

## 2018-09-19 ENCOUNTER — Encounter (HOSPITAL_COMMUNITY): Payer: Self-pay | Admitting: Primary Care

## 2018-09-19 ENCOUNTER — Non-Acute Institutional Stay (SKILLED_NURSING_FACILITY): Payer: Medicare Other | Admitting: Internal Medicine

## 2018-09-19 ENCOUNTER — Encounter: Payer: Self-pay | Admitting: Internal Medicine

## 2018-09-19 DIAGNOSIS — N289 Disorder of kidney and ureter, unspecified: Secondary | ICD-10-CM

## 2018-09-19 DIAGNOSIS — I63413 Cerebral infarction due to embolism of bilateral middle cerebral arteries: Secondary | ICD-10-CM

## 2018-09-19 DIAGNOSIS — I340 Nonrheumatic mitral (valve) insufficiency: Secondary | ICD-10-CM

## 2018-09-19 DIAGNOSIS — I10 Essential (primary) hypertension: Secondary | ICD-10-CM | POA: Diagnosis not present

## 2018-09-19 DIAGNOSIS — M545 Low back pain, unspecified: Secondary | ICD-10-CM

## 2018-09-19 DIAGNOSIS — G301 Alzheimer's disease with late onset: Secondary | ICD-10-CM

## 2018-09-19 DIAGNOSIS — F0280 Dementia in other diseases classified elsewhere without behavioral disturbance: Secondary | ICD-10-CM

## 2018-09-19 DIAGNOSIS — T796XXD Traumatic ischemia of muscle, subsequent encounter: Secondary | ICD-10-CM | POA: Diagnosis not present

## 2018-09-19 LAB — BASIC METABOLIC PANEL
ANION GAP: 8 (ref 5–15)
BUN: 20 mg/dL (ref 8–23)
CO2: 19 mmol/L — ABNORMAL LOW (ref 22–32)
Calcium: 8.2 mg/dL — ABNORMAL LOW (ref 8.9–10.3)
Chloride: 112 mmol/L — ABNORMAL HIGH (ref 98–111)
Creatinine, Ser: 1.16 mg/dL — ABNORMAL HIGH (ref 0.44–1.00)
GFR calc Af Amer: 46 mL/min — ABNORMAL LOW (ref >60)
GFR calc non Af Amer: 40 mL/min — ABNORMAL LOW (ref >60)
Glucose, Bld: 97 mg/dL (ref 70–99)
Potassium: 4.8 mmol/L (ref 3.5–5.1)
Sodium: 139 mmol/L (ref 135–145)

## 2018-09-19 MED ORDER — ASPIRIN 325 MG PO TBEC: 325.0000 mg | DELAYED_RELEASE_TABLET | Freq: Every day | ORAL | 0 refills | Status: AC

## 2018-09-19 NOTE — Progress Notes (Signed)
This is an acute visit.  Level care skilled.  Facility is [facility].  Chief complaint acute visit status post hospitalization for CVA.  History of present illness.  Patient is a pleasant 85 year old female who was recently hospitalized for a CVA thought to be embolic.  She apparently was found at her assisted living facility with right facial droop dysarthria and right-sided weakness.  Evaluation reviewed multiple bilateral cerebral and cerebellar acute infarcts consistent with an embolic stroke.  Anticoagulation was recommended but patient and family declined- apparently there were concerns because a family member had a recent major hemorrhage on anticoagulation.  She was continued on aspirin which was started during her hospitalization.  She is also found to be mildly dehydrated with early rhabdomylolysis-her creatinine peaked at 1.6 it did go down to 1.16 by discharge.  CK level also trended down from 982 down to 322.   Her other medical issues include mitral valve regurgitation- she was not thought to be a good candidate for valve procedure.  Other diagnoses include dementia hypertension and hyperlipidemia-she is not on any medication for this according to her daughter-in-law a former primary care provider recommended a statin for hyperlipidemia but patient refused.  Apparently EKG in the hospital showed an arrhythmia but not definitively atrial fibrillation.  Currently she is resting in bed comfortably has no acute complaints but her family states that she had complained of back pain at times-  Allergies:    Allergies as of 09/16/2018  . (No Known Allergies)       Past Medical History:  Diagnosis Date  . Diverticulitis   . Hyperlipemia   . Hypertension   . Macular degeneration   . OA (osteoarthritis)     Family History:  Family history is significant for son who had a stroke. Unable to provide any other significant family history.  Social  History:  Used to be Theme park manager. Currently living in independent living facility. Has an aide that visits every other day. Able to ambulate with walker and cane. Performs most ADLs on her own. IADLs performed with aid of visiting care taker and family members. POA is his son who had a recent stroke with residual dysarthria but has good mentation.  .  Medications.  She is only on aspirin 325 mg daily.  Review of systems this is somewhat limited since patient is a poor historian and per family does not really ever complain.  In general she is not complaining of fever chills skin does not complain of rashes or itching.  Head ears eyes nose mouth and throat is not complaining of visual changes or sore throat.  Respiratory denies shortness of breath or cough.  Cardiac does not complain of chest pain appears to have fairly minimal lower extremity edema.  GI is not complaining of abdominal pain apparently has a spotty appetite.  GU does not complain of dysuria.  Musculoskeletal- denies joint pain but she has complain of back pain but family.  Neurologic does not complain dizziness or headache does have a history of CVA with some mild residual weakness.  Psych does not complain of being depressed or anxious per family she has had some cognitive decline that has been gradual.  Exam.  Temperature is 97.7 pulse 76 respirations 20 blood pressure 144/74- saturation is 95% on room air.  In general this is a pleasant elderly female in no distress lying comfortably in bed.  Her skin is warm and dry-she does appear to have a small amount of  bruising over the nasal bridge.  Eyes sclera and conjunctive are clear visual acuity appears to be intact pupils appear to be equal round reactive to light.  Oropharynx is clear mucous membranes moist tongue appears to be midline.  Chest is clear to auscultation there is no labored breathing somewhat shallow air entry.  Heart is regular irregular  rate and rhythm with a systolic murmur- she has very mild lower extremity edema.  Abdomen is soft nontender with positive bowel sounds.  Musculoskeletal- moves all extremities x4 Limited exam since he is in bed but possibly has some minimal right sided weakness compared to the left this is the upper and lower extremities  Could not really appreciate deformity of the back or significant tenderness palpation but family says at times she will complain of back discomfort.  Neurologic as noted above her speech is clear-possibly some very mild right-sided weakness compared to left as noted above.  Psych she is oriented to self cannot really name the president said it was Obama- confused to year-.  Per family she has had some gradual cognitive decline  Labs.  10/15/18.  Sodium 139 potassium 4.8 BUN 20 creatinine 1.16.  September 18, 2018.  Liver function test within normal limits except albumin of 3.0 and AST of 42.  WBC 6.4 hemoglobin 12.1 platelets 230.  Assessment and plan.  1.  History of CVA thought to be embolic- she will need therapy evaluation- she is only on aspirin for anticoagulation there is recommendation that if patient and family changes their mind Eliquis would be an appropriate alternative.  She appears to have fairly minimal deficits- continue to monitor.  2.  History of acute kidney injury this appears to be normalizing with a creatinine of 1.16 on lab done today will update this later in the week at this point encourage fluids.  3.  History of rhabdomylolysis-  CK level did come down significantly during her hospitalization.  4.-  History of dementia per her family she has had some gradual cognitive decline and apparently was living in an assisted living facility- suspect ultimate goal is to return there- she continues to be pleasant appropriate but apparently is quite a poor historian.  5.-  History of back pain will obtain an x-ray of the lumbar and thoracic  spine.  6.  Hypokalemia this was supplemented in the hospital and has normalized again will update a lab later this week to ensure stability.  7.  Hypertension-she is not on any medications at this point will monitor it appears patient for an extended period of time has been somewhat resistant to additional medications.  8.  Hyperlipidemia again she is not on any medication she has refused a statin previously   #9 history of mitral valve regurgitation again she is on aspirin-thought not to be a good candidate for aggressive valve repair.  [id number] note greater than 40 minutes spent assessing patient reviewing her chart and labs discussing her status with family at bedside and coordinating and formulating a plan of care- of note greater than 50% of time spent coordinating a plan of care with input as noted above

## 2018-09-19 NOTE — Care Management Important Message (Signed)
Important Message  Patient Details  Name: Lynn Mcintosh MRN: [id number] Date of Birth: July 28, 1921   Medicare Important Message Given:  Yes    Dorena Bodo 10/02/2018, 3:27 PM

## 2018-09-19 NOTE — Clinical Social Work Placement (Signed)
   CLINICAL SOCIAL WORK PLACEMENT  NOTE  Date:  09-26-18  Patient Details  Name: Lynn Mcintosh MRN: [id number] Date of Birth: 02-Jul-1921  Clinical Social Work is seeking post-discharge placement for this patient at the Skilled  Nursing Facility level of care (*CSW will initial, date and re-position this form in  chart as items are completed):      Patient/family provided with [facility] Clinical Social Work Department's list of facilities offering this level of care within the geographic area requested by the patient (or if unable, by the patient's family).  Yes   Patient/family informed of their freedom to choose among providers that offer the needed level of care, that participate in Medicare, Medicaid or managed care program needed by the patient, have an available bed and are willing to accept the patient.      Patient/family informed of [facility]'s ownership interest in [facility] and [facility], as well as of the fact that they are under no obligation to receive care at these facilities.  PASRR submitted to EDS on       PASRR number received on 09/18/18     Existing PASRR number confirmed on       FL2 transmitted to all facilities in geographic area requested by pt/family on 09/18/18     FL2 transmitted to all facilities within larger geographic area on       Patient informed that his/her managed care company has contracts with or will negotiate with certain facilities, including the following:        Yes   Patient/family informed of bed offers received.  Patient chooses bed at [facility]     Physician recommends and patient chooses bed at      Patient to be transferred to [facility] on 09/26/18.  Patient to be transferred to facility by [facility]     Patient family notified on 09/26/2018 of transfer.  Name of family member notified:  Carrol     PHYSICIAN       Additional Comment:     _______________________________________________ Maree Krabbe, LCSW 2018/09/26, 1:33 PM

## 2018-09-19 NOTE — Clinical Social Work Note (Signed)
Clinical Social Worker facilitated patient discharge including contacting patient family and facility to confirm patient discharge plans.  Clinical information faxed to facility and family agreeable with plan.  CSW arranged ambulance transport via [facility] (2:00PM)  to [facility] .  RN to call [phone] for report prior to discharge.  Clinical Social Worker will sign off for now as social work intervention is no longer needed. Please consult [place] again if new need arises.  [place], [place] [phone]

## 2018-09-19 NOTE — Progress Notes (Signed)
   Subjective: No overnight events. Patient is sitting comfortably in bed eating breakfast this morning. She continues to have weakness in the RLE. She otherwise feels well.  Objective:  Vital signs in last 24 hours: Vitals:   09/18/18 0307 09/18/18 0812 09/18/18 1711 09/18/18 2224  BP:  (!) 137/91 (!) 148/67 136/64  Pulse:  83 69 83  Resp:    16  Temp:  98.1 F (36.7 C) 97.8 F (36.6 C) 97.9 F (36.6 C)  TempSrc:  Oral Oral Oral  SpO2:  98% 98% 98%  Weight: 68.6 kg     Height:       General:Elderly femalein no acute distress HENT: Ecchymosis over the nasal bridge CV:RRR, systolic murmur present. Extremities:Ecchymosis of the left wrist. No LE edema  Neuro:Alert. Oriented to person and place (knows she's in the hospital). No facial droop noted. Gross strength 4/5 in the bilateral upper extremities L > R. Gross strength 3/5 in the right lower and 4/5 in the left lower extremity.  Assessment/Plan:  Principal Problem:   CVA (cerebral vascular accident) (HCC) Active Problems:   Rhabdomyolysis   HLD (hyperlipidemia)   HTN (hypertension)  [name] is a 85 yo female with a medical history of dementia, HTN, HLD, and OA who was admitted after a fall, prolonged immobilization after the fall for an unknown period of time,and focal neuro deficits including right lower facial droop, dysarthria, and 2/5 RLE weakness.MRI illustrated multiple scattered acute ischemic infarcts in the bilateral cerebral and cerebellar hemispheres, likely indicative of a cardio embolic etiology.   CVA - Likely cardioembolic. Telemetry shows PACs, but no afib or aflutter. ECHO with normal EF, grade 2 diastolic dysfunction, and severe mitral valve regurgitation, but no intracardiac thrombi. Patient would likely benefit from anticoagulation, however her family has declined medical therapy with anticoagulation and statin. Unclear whether the MV regurgitation could be contributing to her embolic stroke. She is  likely not a candidate for valve repair at her age and with her desired scope of care. The patient has never seen a cardiologist before. - PT recommending SNF. Patient has secured a bed at [facility] that will be available when she is ready to discharge.  - Palliative care consulted for scope of care with family. The patient would like to defer palliative care discussion until a later date. - Continue aspirin - Discharge to SNF today.  Rhabdomyolysis 2/2 prolonged immobilization after fall - Cr improved from 1.98 on arrival to 1.16 today Plan - Discontinue IVF  Elevated Troponin - Peaked at 0.92 - No chest pain. No ischemic EKG changes - No further intervention required  Dispo: Anticipated discharge to SNF today  Lynn Mcintosh, Cathleen Cortieborah N, MD 09/22/2018, 6:03 AM Pager: [phone]

## 2018-09-20 ENCOUNTER — Encounter: Payer: Self-pay | Admitting: Internal Medicine

## 2018-09-20 NOTE — Progress Notes (Signed)
Location:    [facility] Room Number: 156/D Place of Service:  SNF (31) Provider: Hannah Beat, DO  Patient Care Team: Kirt Boys, DO as PCP - General (Internal Medicine)  Extended Emergency Contact Information Primary Emergency Contact: Simonin,Carol Address: [address]          [place], [place] [address] [place] of [place] Home Phone: [phone] Relation: Relative  Code Status:  DNR Goals of care: Advanced Directive information Advanced Directives 09/20/2018  Does Patient Have a Medical Advance Directive? Yes  Type of Estate agent of [place];Out of facility DNR (pink MOST or yellow form)  Does patient want to make changes to medical advance directive? No - Patient declined  Copy of Healthcare Power of Attorney in Chart? No - copy requested     Chief Complaint  Patient presents with  . Hospitalization Follow-up    Hospitalization F/U Visit    HPI:  Pt is a 85 y.o. female seen today for a hospital f/u s/p admission from  Past Medical History:  Diagnosis Date  . Diverticulitis   . Hyperlipemia   . Hypertension   . Macular degeneration   . OA (osteoarthritis)    Past Surgical History:  Procedure Laterality Date  . APPENDECTOMY    . TONSILLECTOMY AND ADENOIDECTOMY      No Known Allergies  Outpatient Encounter Medications as of 09/20/2018  Medication Sig  . aspirin EC 325 MG EC tablet Take 1 tablet (325 mg total) by mouth daily.   No facility-administered encounter medications on file as of 09/20/2018.      Review of Systems  Immunization History  Administered Date(s) Administered  . Pneumococcal Polysaccharide-23 02/11/2017   Pertinent  Health Maintenance Due  Topic Date Due  . INFLUENZA VACCINE  10/21/2018 (Originally 04/13/2018)  . DEXA SCAN  10/21/2018 (Originally 04/02/1988)  . PNA vac Low Risk Adult (2 of 2 - PCV13) 10/21/2018 (Originally 02/11/2018)   Fall Risk  02/11/2017  07/02/2016 04/16/2016  Falls in the past year? No No No   Functional Status Survey:    There were no vitals filed for this visit. There is no height or weight on file to calculate BMI. Physical Exam  Labs reviewed: Recent Labs    09/16/18 1807 09/16/18 1822 09/18/18 0954 October 02, 2018 0316  NA 136 136 139 139  K 3.0* 3.2* 3.2* 4.8  CL 102 104 109 112*  CO2 19*  --  20* 19*  GLUCOSE 122* 116* 132* 97  BUN 20 23 22 20   CREATININE 1.98* 1.40* 1.61* 1.16*  CALCIUM 8.8*  --  8.4* 8.2*   Recent Labs    09/16/18 1807 09/18/18 0954  AST 66* 42*  ALT 19 17  ALKPHOS 83 60  BILITOT 0.5 0.6  PROT 7.3 5.4*  ALBUMIN 3.7 3.0*   Recent Labs    09/16/18 1807 09/16/18 1822 09/18/18 0954  WBC 14.8*  --  6.4  NEUTROABS 12.3*  --   --   HGB 14.9 15.6* 12.1  HCT 45.2 46.0 38.1  MCV 94.6  --  95.3  PLT 322  --  230   Lab Results  Component Value Date   TSH 4.03 02/11/2017   Lab Results  Component Value Date   HGBA1C 4.9 09/17/2018   Lab Results  Component Value Date   CHOL 220 (H) 09/17/2018   HDL 48 09/17/2018   LDLCALC 153 (H) 09/17/2018   TRIG 96 09/17/2018   CHOLHDL 4.6  09/17/2018    Significant Diagnostic Results in last 30 days:  No results found.  Assessment/Plan There are no diagnoses linked to this encounter.   Family/ staff Communication:   Labs/tests ordered:

## 2018-09-21 ENCOUNTER — Encounter (HOSPITAL_COMMUNITY)
Admission: RE | Admit: 2018-09-21 | Discharge: 2018-09-21 | Disposition: A | Discharge status: Home / Self Care | Payer: Medicare Other | Source: Skilled Nursing Facility | Attending: Internal Medicine | Admitting: Internal Medicine

## 2018-09-21 DIAGNOSIS — I69322 Dysarthria following cerebral infarction: Secondary | ICD-10-CM | POA: Insufficient documentation

## 2018-09-21 DIAGNOSIS — I69392 Facial weakness following cerebral infarction: Secondary | ICD-10-CM | POA: Insufficient documentation

## 2018-09-21 DIAGNOSIS — I639 Cerebral infarction, unspecified: Secondary | ICD-10-CM | POA: Insufficient documentation

## 2018-09-21 NOTE — Progress Notes (Signed)
This encounter was created in error - please disregard.

## 2018-09-25 ENCOUNTER — Encounter: Payer: Self-pay | Admitting: Internal Medicine

## 2018-09-25 ENCOUNTER — Non-Acute Institutional Stay (SKILLED_NURSING_FACILITY): Payer: Medicare Other | Admitting: Internal Medicine

## 2018-09-25 DIAGNOSIS — T796XXD Traumatic ischemia of muscle, subsequent encounter: Secondary | ICD-10-CM

## 2018-09-25 DIAGNOSIS — E785 Hyperlipidemia, unspecified: Secondary | ICD-10-CM | POA: Diagnosis not present

## 2018-09-25 DIAGNOSIS — G301 Alzheimer's disease with late onset: Secondary | ICD-10-CM

## 2018-09-25 DIAGNOSIS — I63413 Cerebral infarction due to embolism of bilateral middle cerebral arteries: Secondary | ICD-10-CM

## 2018-09-25 DIAGNOSIS — F0280 Dementia in other diseases classified elsewhere without behavioral disturbance: Secondary | ICD-10-CM

## 2018-09-25 DIAGNOSIS — I10 Essential (primary) hypertension: Secondary | ICD-10-CM

## 2018-09-25 NOTE — Progress Notes (Signed)
Provider:Marcia Lepera Chales Abrahams MD   Location:    [facility] Room Number: 156/D Place of Service:  SNF (31)  PCP: Kirt Boys, DO Patient Care Team: Kirt Boys, DO as PCP - General (Internal Medicine)  Extended Emergency Contact Information Primary Emergency Contact: Spinello,Carol Address: [address]          [place], [place] [address] [place] Home Phone: [phone] Relation: Relative  Code Status: DNR Goals of Care: Advanced Directive information Advanced Directives 09/25/2018  Does Patient Have a Medical Advance Directive? Yes  Type of Estate agent of [place];Out of facility DNR (pink MOST or yellow form)  Does patient want to make changes to medical advance directive? No - Patient declined  Copy of Healthcare Power of Attorney in Chart? No - copy requested      Chief Complaint  Patient presents with  . New Admit To SNF    New Admission Visit    HPI: Patient is a 85 y.o. female seen today for admission to SNF for therapy. She stayed in the hospital from 01/04-01/07 for acute cardioembolic stroke. Patient with history of dementia and hypertension was found on the floor by her nursing aide in her ALF.  Initial focal neurological deficit was noticed including right facial droop dysarthria and right-sided weakness.  MRI was done which showed multiple scattered acute ischemic nonhemorrhagic infarct involving bilateral cerebral and cerebellar hemisphere and Brainstem. Her echo showed moderate to severe MR.  No arrhythmia was noted on EKGs.  Her family decided on only aspirin for anticoagulation.  Day refused anything more stronger than aspirin.  Also refused for any statin therapy Patient is in SNF for therapy.  She does not remember much from hospitalization.  She says she does know she had a stroke. She denied any acute problems of chest pain, shortness of breath, headache. I was unable to get any further history from the  patient    Past Medical History:  Diagnosis Date  . Diverticulitis   . Hyperlipemia   . Hypertension   . Macular degeneration   . OA (osteoarthritis)    Past Surgical History:  Procedure Laterality Date  . APPENDECTOMY    . TONSILLECTOMY AND ADENOIDECTOMY      reports that she has never smoked. She has never used smokeless tobacco. She reports that she does not drink alcohol or use drugs. Social History   Socioeconomic History  . Marital status: Widowed    Spouse name: Not on file  . Number of children: Not on file  . Years of education: 13 yrs   . Highest education level: Not on file  Occupational History  . Occupation: retired  Engineer, production  . Financial resource strain: Not on file  . Food insecurity:    Worry: Not on file    Inability: Not on file  . Transportation needs:    Medical: Not on file    Non-medical: Not on file  Tobacco Use  . Smoking status: Never Smoker  . Smokeless tobacco: Never Used  Substance and Sexual Activity  . Alcohol use: No  . Drug use: No  . Sexual activity: Never  Lifestyle  . Physical activity:    Days per week: Not on file    Minutes per session: Not on file  . Stress: Not on file  Relationships  . Social connections:    Talks on phone: Not on file    Gets together: Not on file    Attends  religious service: Not on file    Active member of club or organization: Not on file    Attends meetings of clubs or organizations: Not on file    Relationship status: Not on file  . Intimate partner violence:    Fear of current or ex partner: Not on file    Emotionally abused: Not on file    Physically abused: Not on file    Forced sexual activity: Not on file  Other Topics Concern  . Not on file  Social History Narrative   Diet?       Do you drink/eat things with caffeine? yes      Marital status?          widowed                          What year were you married?      Do you live in a house, apartment, assisted living, condo,  trailer, etc.? house      Is it one or more stories? One story ranch      How many persons live in your home? 2      Do you have any pets in your home? (please list) 3 cats      Current or past profession: school secretary      Do you exercise? no                                     Type & how often?      Do you have a living will? yes      Do you have a DNR form?  no                                If not, do you want to discuss one? yes      Do you have signed POA/HPOA for forms?  yes    Functional Status Survey:    Family History  Problem Relation Age of Onset  . Cancer Other        family history   . Arthritis Other        family history   . Heart attack Mother [id number]  . Stroke Father [id number]  . Macular degeneration Sister   . 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 INFLUENZA VACCINE  10/21/2018 (Originally 04/13/2018)  . DEXA SCAN  10/21/2018 (Originally 04/02/1988)  . TETANUS/TDAP  10/21/2018 (Originally 04/02/1940)  . PNA vac Low Risk Adult (2 of 2 - PCV13) 10/21/2018 (Originally 02/11/2018)    No Known Allergies  Outpatient Encounter Medications as of 09/25/2018  Medication Sig  . aspirin EC 325 MG EC tablet Take 1 tablet (325 mg total) by mouth daily.  Lucilla Lame. Balsam [place]-Castor Oil (VENELEX) OINT Apply to bilateral buttocks every shift prn for prevention   No facility-administered encounter medications on file as of 09/25/2018.      Review of Systems  Review of Systems  Constitutional: Negative for activity change, appetite change, chills, diaphoresis, fatigue and fever.  HENT: Negative for mouth sores, postnasal drip, rhinorrhea, sinus pain and sore throat.   Respiratory: Negative for apnea, cough, chest tightness, shortness of breath and wheezing.  Cardiovascular: Negative for chest pain, palpitations and leg swelling.  Gastrointestinal: Negative for abdominal distention, abdominal pain,  constipation, diarrhea, nausea and vomiting.  Genitourinary: Negative for dysuria and frequency.  Musculoskeletal: Negative for arthralgias, joint swelling and myalgias.  Skin: Negative for rash.  Neurological: Negative for dizziness, syncope, weakness, light-headedness and numbness.  Psychiatric/Behavioral: Negative for behavioral problems, confusion and sleep disturbance.     There were no vitals filed for this visit. There is no height or weight on file to calculate BMI. Physical Exam Constitutional:      Appearance: Normal appearance.  HENT:     Head: Normocephalic.     Nose: Nose normal.     Mouth/Throat:     Mouth: Mucous membranes are dry.     Pharynx: Oropharynx is clear.  Eyes:     Pupils: Pupils are equal, round, and reactive to light.  Neck:     Musculoskeletal: Neck supple.  Cardiovascular:     Rate and Rhythm: Normal rate and regular rhythm.     Heart sounds: Murmur present.  Pulmonary:     Effort: Pulmonary effort is normal. No respiratory distress.     Breath sounds: Normal breath sounds. No wheezing or rales.  Abdominal:     General: Abdomen is flat. Bowel sounds are normal. There is no distension.     Tenderness: There is no abdominal tenderness. There is no guarding.  Musculoskeletal:     Comments: Mild edema Bilateral  Skin:    General: Skin is warm and dry.  Neurological:     Mental Status: She is alert.     Comments: No focal deficits on my Exam. Did not check the Gait. Patient was oriented to place. But confused on ear and Month. Did know her DOB and age  Psychiatric:        Mood and Affect: Mood normal.        Thought Content: Thought content normal.        Judgment: Judgment normal.     Labs reviewed: Basic Metabolic Panel: Recent Labs    09/16/18 1807 09/16/18 1822 09/18/18 0954 October 04, 2018 0316  NA 136 136 139 139  K 3.0* 3.2* 3.2* 4.8  CL 102 104 109 112*  CO2 19*  --  20* 19*  GLUCOSE 122* 116* 132* 97  BUN 20 23 22 20   CREATININE  1.98* 1.40* 1.61* 1.16*  CALCIUM 8.8*  --  8.4* 8.2*   Liver Function Tests: Recent Labs    09/16/18 1807 09/18/18 0954  AST 66* 42*  ALT 19 17  ALKPHOS 83 60  BILITOT 0.5 0.6  PROT 7.3 5.4*  ALBUMIN 3.7 3.0*   No results for input(s): LIPASE, AMYLASE in the last 8760 hours. No results for input(s): AMMONIA in the last 8760 hours. CBC: Recent Labs    09/16/18 1807 09/16/18 1822 09/18/18 0954  WBC 14.8*  --  6.4  NEUTROABS 12.3*  --   --   HGB 14.9 15.6* 12.1  HCT 45.2 46.0 38.1  MCV 94.6  --  95.3  PLT 322  --  230   Cardiac Enzymes: Recent Labs    09/16/18 1807 09/16/18 2313 09/17/18 0344 09/18/18 0954  CKTOTAL 982*  --   --  322*  TROPONINI  --  0.89* 0.92* 0.32*   BNP: Invalid input(s): POCBNP Lab Results  Component Value Date   HGBA1C 4.9 09/17/2018   Lab Results  Component Value Date   TSH 4.03 02/11/2017   No results found for:  VITAMINB12 No results found for: FOLATE No results found for: IRON, TIBC, FERRITIN  Imaging and Procedures obtained prior to SNF admission: No results found.  Assessment/Plan  Cerebrovascular accident (CVA) due to bilateral embolism of middle cerebral arteries  On Aspirin Family opted out of aggressive treatment Will start therapy  Traumatic rhabdomyolysis,with ARF Renal Function stable Repeat BMP  Hyperlipidemia LDL high but Family refused Secondary prevention  Essential hypertension BP Controlled  No On any Meds Discharge Planning Work with therapy Possible discharge Back to ALF Family/ staff Communication:   Labs/tests ordered:  Total time spent in this patient care encounter was 45_ minutes; greater than 50% of the visit spent counseling patient, reviewing records , Labs and coordinating care for problems addressed at this encounter.

## 2018-09-28 ENCOUNTER — Non-Acute Institutional Stay (SKILLED_NURSING_FACILITY): Payer: Medicare Other | Admitting: Internal Medicine

## 2018-09-28 ENCOUNTER — Encounter: Payer: Self-pay | Admitting: Internal Medicine

## 2018-09-28 DIAGNOSIS — I10 Essential (primary) hypertension: Secondary | ICD-10-CM

## 2018-09-28 DIAGNOSIS — M549 Dorsalgia, unspecified: Secondary | ICD-10-CM

## 2018-09-28 DIAGNOSIS — I63413 Cerebral infarction due to embolism of bilateral middle cerebral arteries: Secondary | ICD-10-CM

## 2018-09-28 DIAGNOSIS — G8929 Other chronic pain: Secondary | ICD-10-CM

## 2018-09-28 DIAGNOSIS — R269 Unspecified abnormalities of gait and mobility: Secondary | ICD-10-CM

## 2018-09-28 DIAGNOSIS — T796XXD Traumatic ischemia of muscle, subsequent encounter: Secondary | ICD-10-CM

## 2018-09-28 NOTE — Progress Notes (Signed)
Location:    [facility] Room Number: 156/D Place of Service:  SNF [phone](31) Provider:  Aundra MilletArlo   Mcintosh, Monica, DO  Patient Care Team: Kirt Boysarter, Monica, DO as PCP - General (Internal Medicine)  Extended Emergency Contact Information Primary Emergency Contact: Mcintosh,Lynn Address: [address]          [place], [place] [id number] [place] Home Phone: [phone] Relation: Relative  Code Status:  DNR Goals of care: Advanced Directive information Advanced Directives 09/28/2018  Does Patient Have a Medical Advance Directive? Yes  Type of Estate agentAdvance Directive Healthcare Power of [place];Out of facility DNR (pink MOST or yellow form)  Does patient want to make changes to medical advance directive? No - Patient declined  Copy of Healthcare Power of Attorney in Chart? No - copy requested     Chief Complaint  Patient presents with  . Acute Visit    Fall    HPI:  Pt is a 85 y.o. female seen today for an acute visit for follow-up of a fall.  Patient is here for rehab after hospitalization earlier this month for an acute cardioembolic stroke.  She also has a history of dementia and hypertension- she was found on the floor by her nursing aide in assisted living noted to have right-sided weakness with right facial droop.  MRI showed the CVA involving bilateral cerebral and cerebellar hemisphere and brainstem.  Her family decided on only aspirin for anticoagulation-refused anything stronger more than aspirin.  Statin therapy also was refused.  Her stay here is been fairly unremarkable however apparently earlier this week she did have a fall- somewhat questionable history but she did complain of some back pain-although there is some chronicity to her back pain complaints.  X-ray was ordered which did not show any acute process.  Today she is sitting comfortably in her wheelchair and denies any problems denies back pain.  Appears to be in good spirits  she is bright and alert.  Vital signs appear to be stable.  She does have a listed history of hypertension but blood pressure today is listed as 124/68 and I got a comparable manual reading- appears some previous listed blood pressures in the 140s-I suspect with her advanced age and fall risk will be somewhat conservative with her blood pressures  Her other medical issues appear to be stable she does not appear to have significant focal deficits status post CVA.  In regards to rhabdo myelosis with acute renal failure this appears to have stabilized a creatinine of 1.16 on January 7 we will update this.  She also apparently had hypokalemia which was supplemented potassium was 4.8 on lab done January 7 and will update this     Past Medical History:  Diagnosis Date  . Diverticulitis   . Hyperlipemia   . Hypertension   . Macular degeneration   . OA (osteoarthritis)    Past Surgical History:  Procedure Laterality Date  . APPENDECTOMY    . TONSILLECTOMY AND ADENOIDECTOMY      No Known Allergies  Outpatient Encounter Medications as of 09/28/2018  Medication Sig  . aspirin EC 325 MG EC tablet Take 1 tablet (325 mg total) by mouth daily.  Lucilla Lame. Balsam [place]-Castor Oil (VENELEX) OINT Apply to bilateral buttocks every shift prn for prevention   No facility-administered encounter medications on file as of 09/28/2018.     Review of Systems   General she is not complaining of any fever or chills.  Skin she does  have a well-healed low back scar.  Eyes does not complain of visual changes.  Throat does not complain of any sore throat.  Respiratory is not complaining of shortness of breath or cough.  Cardiac denies chest pain has some mild baseline lower extremity edema.  GI does not complain of nausea vomiting diarrhea constipation.  Musculoskeletal denies joint pain or back pain today- apparently does have some chronic back pain at times she does not express that today.  Neurologic  is not complaining of dizziness headache or syncope.  And psych appears to be in good spirits does not appear to be depressed or anxious  Immunization History  Administered Date(s) Administered  . Pneumococcal Polysaccharide-23 02/11/2017   Pertinent  Health Maintenance Due  Topic Date Due  . INFLUENZA VACCINE  10/21/2018 (Originally 04/13/2018)  . DEXA SCAN  10/21/2018 (Originally 04/02/1988)  . PNA vac Low Risk Adult (2 of 2 - PCV13) 10/21/2018 (Originally 02/11/2018)   Fall Risk  02/11/2017 07/02/2016 04/16/2016  Falls in the past year? No No No   Functional Status Survey:    Vitals:   09/28/18 1348  BP: 124/68  Pulse: 76  Resp: 18  Temp: 98.2 F (36.8 C)  TempSrc: Oral   Weight is 148.6 pounds Physical Exam   In general this is a pleasant elderly female in no distress sitting comfortably in her wheelchair she is eating lunch.  Her skin is warm and dry do note a old well-healed surgical scar low back  Eyes visual acuity appears to be intact sclera and conjunctive are clear she has prescription lenses pupils are equal round reactive to light   Oropharynx is clear mucous membranes moist tongue is midline  Chest is clear to auscultation there is no labored breathing  Heart regular rate and rhythm with a systolic murmur-- some irregular beats-- she has mild lower extremity edema bilaterally.  Her abdomen is soft nontender with positive bowel sounds.  Musculoskeletal is able move all extremities x4 I do not note any pain with gentle range of motion her knees hip-there is no tenderness to palpation of her back.  Neurologic is grossly intact her speech is clear could not really appreciate any significant focal deficits  Psych she is pleasant and appropriate Labs reviewed: Recent Labs    09/16/18 1807 09/16/18 1822 09/18/18 0954 2018-10-11 0316  NA 136 136 139 139  K 3.0* 3.2* 3.2* 4.8  CL 102 104 109 112*  CO2 19*  --  20* 19*  GLUCOSE 122* 116* 132* 97  BUN 20 23 22  20   CREATININE 1.98* 1.40* 1.61* 1.16*  CALCIUM 8.8*  --  8.4* 8.2*   Recent Labs    09/16/18 1807 09/18/18 0954  AST 66* 42*  ALT 19 17  ALKPHOS 83 60  BILITOT 0.5 0.6  PROT 7.3 5.4*  ALBUMIN 3.7 3.0*   Recent Labs    09/16/18 1807 09/16/18 1822 09/18/18 0954  WBC 14.8*  --  6.4  NEUTROABS 12.3*  --   --   HGB 14.9 15.6* 12.1  HCT 45.2 46.0 38.1  MCV 94.6  --  95.3  PLT 322  --  230   Lab Results  Component Value Date   TSH 4.03 02/11/2017   Lab Results  Component Value Date   HGBA1C 4.9 09/17/2018   Lab Results  Component Value Date   CHOL 220 (H) 09/17/2018   HDL 48 09/17/2018   LDLCALC 153 (H) 09/17/2018   TRIG 96 09/17/2018  CHOLHDL 4.6 09/17/2018    Significant Diagnostic Results in last 30 days:  Dg Chest 2 View  Result Date: 09/16/2018 CLINICAL DATA:  Altered mental status. EXAM: CHEST - 2 VIEW COMPARISON:  02/18/2017 FINDINGS: The cardiac silhouette is normal in size and configuration. No mediastinal or hilar masses. No evidence of adenopathy. Clear lungs.  No pleural effusion or pneumothorax. Skeletal structures are demineralized but intact. IMPRESSION: No active cardiopulmonary disease. Electronically Signed   By: Amie Portlandavid  Ormond M.D.   On: 09/16/2018 19:18   Dg Pelvis 1-2 Views  Result Date: 09/16/2018 CLINICAL DATA:  Found on the floor, RIGHT hip pain EXAM: PELVIS - 1-2 VIEW COMPARISON:  None FINDINGS: Diffuse osseous demineralization. Narrowing of the hip joints bilaterally. SI joints grossly preserved. No definite fracture, dislocation, or bone destruction. Degenerative disc and facet disease changes at visualized lumbar spine. Scattered atherosclerotic calcifications. IMPRESSION: No acute osseous abnormalities. Osseous demineralization with degenerative changes of the hip joints bilaterally. Electronically Signed   By: Ulyses SouthwardMark  Boles M.D.   On: 09/16/2018 23:03   Ct Head Wo Contrast  Result Date: 09/16/2018 CLINICAL DATA:  Patient found on the floor  assisted living home. EXAM: CT HEAD WITHOUT CONTRAST TECHNIQUE: Contiguous axial images were obtained from the base of the skull through the vertex without intravenous contrast. COMPARISON:  11/08/2013 FINDINGS: Brain: No evidence of acute infarction, hemorrhage, hydrocephalus, extra-axial collection or mass lesion/mass effect. There is ventricular and sulcal enlargement reflecting age-appropriate volume loss. Patchy white matter hypoattenuation is also noted consistent with moderate chronic microvascular ischemic change. Vascular: No hyperdense vessel or unexpected calcification. Skull: Normal. Negative for fracture or focal lesion. Sinuses/Orbits: Globes and orbits are unremarkable. Visualized sinuses and mastoid air cells are clear. Other: None. IMPRESSION: 1. No acute intracranial abnormalities. 2. Age-appropriate volume loss. Moderate chronic microvascular ischemic change. Electronically Signed   By: Amie Portlandavid  Ormond M.D.   On: 09/16/2018 19:44   Mr Maxine GlennMra Head Wo Contrast  Result Date: 09/17/2018 CLINICAL DATA:  Multiple acute infarctions. EXAM: MRA NECK WITHOUT CONTRAST MRA HEAD WITHOUT CONTRAST TECHNIQUE: Multiplanar and multiecho pulse sequences of the neck were obtained without intravenous contrast. Angiographic images of the neck were obtained using MRA technique without intravenous contast.; Angiographic images of the Circle of Willis were obtained using MRA technique without intravenous contrast. COMPARISON:  MRI earlier same day FINDINGS: MRA NECK FINDINGS Both common carotid arteries are widely patent to the bifurcation. Both carotid bifurcations are patent without narrowing or irregularity of the internal carotid arteries. Stenosis the proximal external carotid arteries is noted on each side. Both vertebral arteries show antegrade flow. MRA HEAD FINDINGS Both internal carotid arteries are widely patent into the brain. No siphon stenosis. The anterior and middle cerebral vessels are patent without  proximal stenosis, aneurysm or vascular malformation. Both vertebral arteries are widely patent to the basilar. No basilar stenosis. Posterior circulation branch vessels appear normal. IMPRESSION: MRA neck: No internal carotid stenosis or irregularity on either side. Bilateral external carotid artery stenoses. No posterior circulation compromise evident. MRA head: Normal appearance of the large and medium size vessels. Electronically Signed   By: Paulina FusiMark  Shogry M.D.   On: 09/17/2018 10:08   Mr Maxine GlennMra Neck Wo Contrast  Result Date: 09/17/2018 CLINICAL DATA:  Multiple acute infarctions. EXAM: MRA NECK WITHOUT CONTRAST MRA HEAD WITHOUT CONTRAST TECHNIQUE: Multiplanar and multiecho pulse sequences of the neck were obtained without intravenous contrast. Angiographic images of the neck were obtained using MRA technique without intravenous contast.; Angiographic images of the Circle  of Willis were obtained using MRA technique without intravenous contrast. COMPARISON:  MRI earlier same day FINDINGS: MRA NECK FINDINGS Both common carotid arteries are widely patent to the bifurcation. Both carotid bifurcations are patent without narrowing or irregularity of the internal carotid arteries. Stenosis the proximal external carotid arteries is noted on each side. Both vertebral arteries show antegrade flow. MRA HEAD FINDINGS Both internal carotid arteries are widely patent into the brain. No siphon stenosis. The anterior and middle cerebral vessels are patent without proximal stenosis, aneurysm or vascular malformation. Both vertebral arteries are widely patent to the basilar. No basilar stenosis. Posterior circulation branch vessels appear normal. IMPRESSION: MRA neck: No internal carotid stenosis or irregularity on either side. Bilateral external carotid artery stenoses. No posterior circulation compromise evident. MRA head: Normal appearance of the large and medium size vessels. Electronically Signed   By: Paulina Fusi M.D.   On:  09/17/2018 10:08   Mr Brain Wo Contrast  Result Date: 09/17/2018 CLINICAL DATA:  Initial evaluation for acute altered mental status, found down. EXAM: MRI HEAD WITHOUT CONTRAST TECHNIQUE: Multiplanar, multiecho pulse sequences of the brain and surrounding structures were obtained without intravenous contrast. COMPARISON:  Prior CT from 09/16/2018. FINDINGS: Brain: Diffuse prominence of the CSF containing spaces compatible with generalized age-related cerebral atrophy. Patchy and confluent T2/FLAIR hyperintensity within the periventricular and deep white matter most consistent with chronic microvascular ischemic disease, moderate in nature. Multiple scattered acute ischemic infarcts seen involving the bilateral cerebral and cerebellar hemispheres as well as the brainstem. For reference purposes, largest infarct within the left cerebral hemisphere located at the anterior inferior left basal ganglia and measures 8 mm (series 5, image 67). Largest infarct on the right cerebral hemisphere involves the postcentral gyrus and measures 11 mm (series 5, image 79). Largest infratentorial infarct involves the left paramedian dorsal pons and measures 15 mm in length (series 5, image 60). No associated hemorrhage or mass effect. No other evidence for acute or subacute ischemia. Gray-white matter differentiation otherwise maintained. No encephalomalacia to suggest chronic cortical infarction. No evidence for acute or chronic intracranial hemorrhage. No mass lesion, midline shift or mass effect. No hydrocephalus. No extra-axial fluid collection. Incidental note made of an empty sella. Vascular: Major intracranial vascular flow voids maintained. Skull and upper cervical spine: Craniocervical junction within normal limits. Mild cervical spondylolysis noted within the upper cervical spine without significant stenosis. Bone marrow signal intensity normal. No scalp soft tissue abnormality. Sinuses/Orbits: Patient status post  bilateral ocular lens replacement. Scattered mucosal thickening throughout the paranasal sinuses, chronic in appearance. No air-fluid level to suggest acute sinusitis. Small bilateral mastoid effusions, of doubtful clinical significance. Inner ear structures grossly normal. Other: None. IMPRESSION: 1. Multiple scattered predominantly subcentimeter acute ischemic nonhemorrhagic infarcts involving the bilateral cerebral and cerebellar hemispheres as well as the brainstem. A central thromboembolic etiology is suspected. 2. Underlying age-related cerebral atrophy with moderate chronic small vessel ischemic disease. Electronically Signed   By: Rise Mu M.D.   On: 09/17/2018 03:47    Assessment/Plan  #1 history of fall--gait abnormality--with no apparent injury-at this point will monitor denying any pain here neurologically appears to be intact-x-ray was reassuring of her back.  2.  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  Appears to have made a fairly nice recovery from  3 history of traumatic rhabdomylolysis with acute renal failure this appears to have  stabilized will update a metabolic panel.  4.  History of hypokalemia this was supplemented in the hospital will update this to ensure stability.  5.  History of hypertension as noted above this appears to be fairly stable systolics taken manually and by machine today were in the 120s appears she has some listed readings of 140 systolically insistent-again with her history of falling would be hesitant to be real aggressive secondary to concerns of hypotension.  6 history of mitral valve regurgitation her weight and edema appears to be stable she is not complaining of any chest pain.  [id number]

## 2018-09-29 ENCOUNTER — Encounter (HOSPITAL_COMMUNITY)
Admission: RE | Admit: 2018-09-29 | Discharge: 2018-09-29 | Disposition: A | Discharge status: Home / Self Care | Payer: Medicare Other | Source: Skilled Nursing Facility | Attending: Internal Medicine | Admitting: Internal Medicine

## 2018-09-29 DIAGNOSIS — I639 Cerebral infarction, unspecified: Secondary | ICD-10-CM | POA: Insufficient documentation

## 2018-09-29 DIAGNOSIS — I69322 Dysarthria following cerebral infarction: Secondary | ICD-10-CM | POA: Insufficient documentation

## 2018-09-29 LAB — CBC WITH DIFFERENTIAL/PLATELET
Abs Immature Granulocytes: 0.02 10*3/uL (ref 0.00–0.07)
Basophils Absolute: 0.1 10*3/uL (ref 0.0–0.1)
Basophils Relative: 1 %
Eosinophils Absolute: 0.1 10*3/uL (ref 0.0–0.5)
Eosinophils Relative: 3 %
HCT: 36.6 % (ref 36.0–46.0)
Hemoglobin: 11.5 g/dL — ABNORMAL LOW (ref 12.0–15.0)
Immature Granulocytes: 0 %
Lymphocytes Relative: 22 %
Lymphs Abs: 1.1 10*3/uL (ref 0.7–4.0)
MCH: 31 pg (ref 26.0–34.0)
MCHC: 31.4 g/dL (ref 30.0–36.0)
MCV: 98.7 fL (ref 80.0–100.0)
Monocytes Absolute: 0.6 10*3/uL (ref 0.1–1.0)
Monocytes Relative: 11 %
NEUTROS ABS: 3.3 10*3/uL (ref 1.7–7.7)
Neutrophils Relative %: 63 %
Platelets: 208 10*3/uL (ref 150–400)
RBC: 3.71 MIL/uL — ABNORMAL LOW (ref 3.87–5.11)
RDW: 13.5 % (ref 11.5–15.5)
WBC: 5.2 10*3/uL (ref 4.0–10.5)
nRBC: 0 % (ref 0.0–0.2)

## 2018-09-29 LAB — BASIC METABOLIC PANEL
Anion gap: 8 (ref 5–15)
BUN: 15 mg/dL (ref 8–23)
CO2: 21 mmol/L — ABNORMAL LOW (ref 22–32)
Calcium: 8.4 mg/dL — ABNORMAL LOW (ref 8.9–10.3)
Chloride: 111 mmol/L (ref 98–111)
Creatinine, Ser: 1.02 mg/dL — ABNORMAL HIGH (ref 0.44–1.00)
GFR calc Af Amer: 54 mL/min — ABNORMAL LOW (ref >60)
GFR calc non Af Amer: 47 mL/min — ABNORMAL LOW (ref >60)
GLUCOSE: 93 mg/dL (ref 70–99)
Potassium: 3.4 mmol/L — ABNORMAL LOW (ref 3.5–5.1)
Sodium: 140 mmol/L (ref 135–145)

## 2018-10-02 ENCOUNTER — Encounter (HOSPITAL_COMMUNITY)
Admission: RE | Admit: 2018-10-02 | Discharge: 2018-10-02 | Disposition: A | Discharge status: Home / Self Care | Payer: Medicare Other | Source: Skilled Nursing Facility | Attending: *Deleted | Admitting: *Deleted

## 2018-10-02 LAB — CBC WITH DIFFERENTIAL/PLATELET
Abs Immature Granulocytes: 0.02 10*3/uL (ref 0.00–0.07)
Basophils Absolute: 0.1 10*3/uL (ref 0.0–0.1)
Basophils Relative: 1 %
Eosinophils Absolute: 0.1 10*3/uL (ref 0.0–0.5)
Eosinophils Relative: 2 %
HCT: 39.6 % (ref 36.0–46.0)
Hemoglobin: 12.3 g/dL (ref 12.0–15.0)
Immature Granulocytes: 0 %
Lymphocytes Relative: 23 %
Lymphs Abs: 1.3 10*3/uL (ref 0.7–4.0)
MCH: 30.9 pg (ref 26.0–34.0)
MCHC: 31.1 g/dL (ref 30.0–36.0)
MCV: 99.5 fL (ref 80.0–100.0)
Monocytes Absolute: 0.5 10*3/uL (ref 0.1–1.0)
Monocytes Relative: 9 %
Neutro Abs: 3.6 10*3/uL (ref 1.7–7.7)
Neutrophils Relative %: 65 %
Platelets: 236 10*3/uL (ref 150–400)
RBC: 3.98 MIL/uL (ref 3.87–5.11)
RDW: 13.4 % (ref 11.5–15.5)
WBC: 5.6 10*3/uL (ref 4.0–10.5)
nRBC: 0 % (ref 0.0–0.2)

## 2018-10-02 LAB — COMPREHENSIVE METABOLIC PANEL
ALBUMIN: 3.2 g/dL — AB (ref 3.5–5.0)
ALT: 11 U/L (ref 0–44)
AST: 17 U/L (ref 15–41)
Alkaline Phosphatase: 73 U/L (ref 38–126)
Anion gap: 8 (ref 5–15)
BILIRUBIN TOTAL: 0.5 mg/dL (ref 0.3–1.2)
BUN: 17 mg/dL (ref 8–23)
CALCIUM: 8.6 mg/dL — AB (ref 8.9–10.3)
CO2: 22 mmol/L (ref 22–32)
Chloride: 111 mmol/L (ref 98–111)
Creatinine, Ser: 1.17 mg/dL — ABNORMAL HIGH (ref 0.44–1.00)
GFR calc Af Amer: 46 mL/min — ABNORMAL LOW (ref >60)
GFR calc non Af Amer: 40 mL/min — ABNORMAL LOW (ref >60)
GLUCOSE: 99 mg/dL (ref 70–99)
Potassium: 3.6 mmol/L (ref 3.5–5.1)
Sodium: 141 mmol/L (ref 135–145)
Total Protein: 6.2 g/dL — ABNORMAL LOW (ref 6.5–8.1)

## 2018-10-09 ENCOUNTER — Encounter (HOSPITAL_COMMUNITY)
Admission: RE | Admit: 2018-10-09 | Discharge: 2018-10-09 | Disposition: A | Discharge status: Home / Self Care | Payer: Medicare Other | Source: Skilled Nursing Facility | Attending: *Deleted | Admitting: *Deleted

## 2018-10-09 LAB — BASIC METABOLIC PANEL
Anion gap: 6 (ref 5–15)
BUN: 18 mg/dL (ref 8–23)
CALCIUM: 8.5 mg/dL — AB (ref 8.9–10.3)
CO2: 21 mmol/L — ABNORMAL LOW (ref 22–32)
Chloride: 113 mmol/L — ABNORMAL HIGH (ref 98–111)
Creatinine, Ser: 1.02 mg/dL — ABNORMAL HIGH (ref 0.44–1.00)
GFR calc Af Amer: 54 mL/min — ABNORMAL LOW (ref >60)
GFR calc non Af Amer: 47 mL/min — ABNORMAL LOW (ref >60)
Glucose, Bld: 93 mg/dL (ref 70–99)
Potassium: 3.9 mmol/L (ref 3.5–5.1)
Sodium: 140 mmol/L (ref 135–145)

## 2018-10-14 DEATH — deceased

## 2018-10-19 ENCOUNTER — Encounter (HOSPITAL_COMMUNITY)
Admission: RE | Admit: 2018-10-19 | Discharge: 2018-10-19 | Disposition: A | Discharge status: Home / Self Care | Payer: Medicare Other | Source: Skilled Nursing Facility | Attending: Internal Medicine | Admitting: Internal Medicine

## 2018-10-19 DIAGNOSIS — I69322 Dysarthria following cerebral infarction: Secondary | ICD-10-CM | POA: Diagnosis present

## 2018-10-19 DIAGNOSIS — I639 Cerebral infarction, unspecified: Secondary | ICD-10-CM | POA: Diagnosis present

## 2018-10-19 DIAGNOSIS — I69392 Facial weakness following cerebral infarction: Secondary | ICD-10-CM | POA: Diagnosis present

## 2018-10-19 DIAGNOSIS — I69998 Other sequelae following unspecified cerebrovascular disease: Secondary | ICD-10-CM | POA: Insufficient documentation

## 2018-10-19 LAB — MAGNESIUM: Magnesium: 2.1 mg/dL (ref 1.7–2.4)

## 2018-10-31 ENCOUNTER — Non-Acute Institutional Stay (SKILLED_NURSING_FACILITY): Payer: Medicare Other | Admitting: Adult Health

## 2018-10-31 ENCOUNTER — Encounter: Payer: Self-pay | Admitting: Adult Health

## 2018-10-31 DIAGNOSIS — I63413 Cerebral infarction due to embolism of bilateral middle cerebral arteries: Secondary | ICD-10-CM | POA: Diagnosis not present

## 2018-10-31 DIAGNOSIS — E782 Mixed hyperlipidemia: Secondary | ICD-10-CM | POA: Diagnosis not present

## 2018-10-31 DIAGNOSIS — F0280 Dementia in other diseases classified elsewhere without behavioral disturbance: Secondary | ICD-10-CM

## 2018-10-31 DIAGNOSIS — G301 Alzheimer's disease with late onset: Secondary | ICD-10-CM | POA: Diagnosis not present

## 2018-10-31 DIAGNOSIS — E876 Hypokalemia: Secondary | ICD-10-CM

## 2018-10-31 DIAGNOSIS — I10 Essential (primary) hypertension: Secondary | ICD-10-CM

## 2018-10-31 DIAGNOSIS — R634 Abnormal weight loss: Secondary | ICD-10-CM

## 2018-10-31 NOTE — Progress Notes (Signed)
Location:   [facility] Room Number: [id number] Place of Service:  SNF (31)   CODE STATUS: DNR  No Known Allergies  Chief Complaint  Patient presents with  . Medical Management of Chronic Issues    Essential hypertension; cerebrovascular accdient (cva) due to bilateral embolism of middle cerebral arteries; late onset alzheimer's disease without behavorial disturbance.     HPI:  She is a 85 year old long term resident of this facility being seen for the management of her chronic illnesses: hypertension; cva; alzheimer's disease. She denies any uncontrolled pain; no changes in her appetite; no insomnia; no anxiety; no reports of agitation.   Past Medical History:  Diagnosis Date  . Diverticulitis   . Hyperlipemia   . Hypertension   . Macular degeneration   . OA (osteoarthritis)     Past Surgical History:  Procedure Laterality Date  . APPENDECTOMY    . TONSILLECTOMY AND ADENOIDECTOMY      Social History   Socioeconomic History  . Marital status: Widowed    Spouse name: Not on file  . Number of children: Not on file  . Years of education: 13 yrs   . Highest education level: Not on file  Occupational History  . Occupation: retired  Engineer, production  . Financial resource strain: Not on file  . Food insecurity:    Worry: Not on file    Inability: Not on file  . Transportation needs:    Medical: Not on file    Non-medical: Not on file  Tobacco Use  . Smoking status: Never Smoker  . Smokeless tobacco: Never Used  Substance and Sexual Activity  . Alcohol use: No  . Drug use: No  . Sexual activity: Never  Lifestyle  . Physical activity:    Days per week: Not on file    Minutes per session: Not on file  . Stress: Not on file  Relationships  . Social connections:    Talks on phone: Not on file    Gets together: Not on file    Attends religious service: Not on file    Active member of club or organization: Not on file    Attends meetings of  clubs or organizations: Not on file    Relationship status: Not on file  . Intimate partner violence:    Fear of current or ex partner: Not on file    Emotionally abused: Not on file    Physically abused: Not on file    Forced sexual activity: Not on file  Other Topics Concern  . Not on file  Social History Narrative   Diet?       Do you drink/eat things with caffeine? yes      Marital status?          widowed                          What year were you married?      Do you live in a house, apartment, assisted living, condo, trailer, etc.? house      Is it one or more stories? One story ranch      How many persons live in your home? 2      Do you have any pets in your home? (please list) 3 cats      Current or past profession: school secretary      Do you exercise? no  Type & how often?      Do you have a living will? yes      Do you have a DNR form?  no                                If not, do you want to discuss one? yes      Do you have signed POA/HPOA for forms?  yes   Family History  Problem Relation Age of Onset  . Cancer Other        family history   . Arthritis Other        family history   . Heart attack Mother [id number]  . Stroke Father [id number]  . Macular degeneration Sister   . 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 aspirin EC 325 MG EC tablet Take 1 tablet (325 mg total) by mouth daily.  Lucilla Lame. Balsam [place]-Castor Oil (VENELEX) OINT Apply to bilateral buttocks every shift prn for prevention  . NON FORMULARY Diet type:  Regular  . Nutritional Supplements (ENSURE ENLIVE PO) Give 120 ml by mouth two times daily between meals  . potassium chloride (K-DUR,KLOR-CON) 10 MEQ tablet  Take 10 mEq by mouth daily.   No facility-administered encounter medications on file as of 10/31/2018.      SIGNIFICANT DIAGNOSTIC EXAMS  LABS REVIEWED TODAY:   09-17-18: hgb a1c 4.9; chol 220; ldl 153; trig 96; hdl 48 10-02-18: wbc 5.6; hgb 12.3; hct 39.6; mcv 99.5; plt 236; glucose 99; bun 17; creat 1.17; k+ 3.6; na++ 141; ca 8.6; liver normal albumin 3.2 10-09-18: glucose 93; bun 18; creat 1.02 ;k+ 3.9; na++140 ca 8.5  10-19-18: mag 2.1  Review of Systems  Constitutional: Negative for malaise/fatigue.  Respiratory: Negative for cough and shortness of breath.   Cardiovascular: Negative for chest pain, palpitations and leg swelling.  Gastrointestinal: Negative for abdominal pain, constipation and heartburn.  Musculoskeletal: Negative for back pain, joint pain and myalgias.  Skin: Negative.   Neurological: Negative for dizziness.  Psychiatric/Behavioral: The patient is not nervous/anxious.     Physical Exam Constitutional:      General: She is not in acute distress.    Appearance: She is well-developed. She is not diaphoretic.  Neck:     Musculoskeletal: Neck supple.     Thyroid: No thyromegaly.  Cardiovascular:     Rate and Rhythm: Normal rate and regular rhythm.     Pulses: Normal pulses.     Heart sounds: Murmur present.     Comments: 3/6 Pulmonary:     Effort: Pulmonary effort is normal. No respiratory distress.     Breath sounds: Normal breath sounds.  Abdominal:     General: Bowel sounds are normal. There is no distension.     Palpations: Abdomen is soft.     Tenderness: There is no abdominal tenderness.  Musculoskeletal:     Right lower leg: Edema present.     Left lower leg: Edema present.     Comments: Trace bilateral lower extremity  edema Is able to move all extremities   Lymphadenopathy:     Cervical: No cervical adenopathy.  Skin:    General: Skin is warm and dry.     Comments: Bilateral lower extremities discolored   Neurological:     Mental Status: She  is alert. Mental status is at baseline.  Psychiatric:        Mood and Affect: Mood normal.      ASSESSMENT/ PLAN:  TODAY  1. Essential hypertension: is stable b/p 137/79: will continue to monitor her status  2. Cerebrovascular accident (cva) due to bilateral embolism of middle cerebral arteries: is neurologically stable will monitor   3. Late onset alzheimer's disease: is without change weight is 145 pounds will monitor  4. Mixed hyperlipidemia: stable LDL 153; will monitor   5. Hypokalemia: is stable k+ 3.9; will continue k+ 10 meq daily   6. Weight loss non-intentional: weight in Jan 152 current weight is 145 pounds; will continue supplements as directed     MD is aware of resident's narcotic use and is in agreement with current plan of care. We will attempt to wean resident as apropriate   Synthia Innocent NP [facility]  Contact [phone] Monday through Friday 8am- 5pm  After hours call [phone]

## 2018-11-03 DIAGNOSIS — E876 Hypokalemia: Secondary | ICD-10-CM | POA: Insufficient documentation

## 2018-11-03 DIAGNOSIS — R634 Abnormal weight loss: Secondary | ICD-10-CM | POA: Insufficient documentation

## 2018-11-30 ENCOUNTER — Non-Acute Institutional Stay (SKILLED_NURSING_FACILITY): Payer: Medicare Other | Admitting: Adult Health

## 2018-11-30 ENCOUNTER — Encounter: Payer: Self-pay | Admitting: Adult Health

## 2018-11-30 DIAGNOSIS — F0280 Dementia in other diseases classified elsewhere without behavioral disturbance: Secondary | ICD-10-CM

## 2018-11-30 DIAGNOSIS — E782 Mixed hyperlipidemia: Secondary | ICD-10-CM

## 2018-11-30 DIAGNOSIS — G301 Alzheimer's disease with late onset: Secondary | ICD-10-CM

## 2018-11-30 DIAGNOSIS — E876 Hypokalemia: Secondary | ICD-10-CM

## 2018-11-30 NOTE — Progress Notes (Signed)
Location:   [facility] Room Number: [id number] Place of Service:  SNF (31)   CODE STATUS: DNR  No Known Allergies  Chief Complaint  Patient presents with  . Medical Management of Chronic Issues    Late onset alzheimer's disease without behavioral disturbance; mixed hyperlipidemia; hypokalemia     HPI:  She is a 85 year old long term resident of this facility being seen for the management of her chronic illnesses: dementia; hypolipidemia; hypokalemia. She denies any uncontrolled pain; no changes in appetite;no anxiety; no insomnia. There are no reports of fevers present.   Past Medical History:  Diagnosis Date  . Diverticulitis   . Hyperlipemia   . Hypertension   . Macular degeneration   . OA (osteoarthritis)     Past Surgical History:  Procedure Laterality Date  . APPENDECTOMY    . TONSILLECTOMY AND ADENOIDECTOMY      Social History   Socioeconomic History  . Marital status: Widowed    Spouse name: Not on file  . Number of children: Not on file  . Years of education: 13 yrs   . Highest education level: Not on file  Occupational History  . Occupation: retired  Engineer, production  . Financial resource strain: Not on file  . Food insecurity:    Worry: Not on file    Inability: Not on file  . Transportation needs:    Medical: Not on file    Non-medical: Not on file  Tobacco Use  . Smoking status: Never Smoker  . Smokeless tobacco: Never Used  Substance and Sexual Activity  . Alcohol use: No  . Drug use: No  . Sexual activity: Never  Lifestyle  . Physical activity:    Days per week: Not on file    Minutes per session: Not on file  . Stress: Not on file  Relationships  . Social connections:    Talks on phone: Not on file    Gets together: Not on file    Attends religious service: Not on file    Active member of club or organization: Not on file    Attends meetings of clubs or organizations: Not on file    Relationship status: Not on  file  . Intimate partner violence:    Fear of current or ex partner: Not on file    Emotionally abused: Not on file    Physically abused: Not on file    Forced sexual activity: Not on file  Other Topics Concern  . Not on file  Social History Narrative   Diet?       Do you drink/eat things with caffeine? yes      Marital status?          widowed                          What year were you married?      Do you live in a house, apartment, assisted living, condo, trailer, etc.? house      Is it one or more stories? One story ranch      How many persons live in your home? 2      Do you have any pets in your home? (please list) 3 cats      Current or past profession: school secretary      Do you exercise? no  Type & how often?      Do you have a living will? yes      Do you have a DNR form?  no                                If not, do you want to discuss one? yes      Do you have signed POA/HPOA for forms?  yes   Family History  Problem Relation Age of Onset  . Cancer Other        family history   . Arthritis Other        family history   . Heart attack Mother 62  . Stroke Father 57  . Macular degeneration Sister   . 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 aspirin EC 325 MG EC tablet Take 1 tablet (325 mg total) by mouth daily.  Lucilla Lame [place]-Castor Oil (VENELEX) OINT Apply to bilateral buttocks every shift prn for prevention  . NON FORMULARY Diet type:  Regular  . Nutritional Supplements (ENSURE ENLIVE PO) Give 120 ml by mouth two times daily between meals  . potassium chloride (K-DUR,KLOR-CON) 10 MEQ tablet Take 10 mEq by mouth daily.   No facility-administered encounter  medications on file as of 11/30/2018.      SIGNIFICANT DIAGNOSTIC EXAMS  LABS REVIEWED PREVIOUS:   09-17-18: hgb a1c 4.9; chol 220; ldl 153; trig 96; hdl 48 10-02-18: wbc 5.6; hgb 12.3; hct 39.6; mcv 99.5; plt 236; glucose 99; bun 17; creat 1.17; k+ 3.6; na++ 141; ca 8.6; liver normal albumin 3.2 10-09-18: glucose 93; bun 18; creat 1.02 ;k+ 3.9; na++140 ca 8.5  10-19-18: mag 2.1  NO NEW LABS.   Review of Systems  Constitutional: Negative for malaise/fatigue.  Respiratory: Negative for cough and shortness of breath.   Cardiovascular: Negative for chest pain, palpitations and leg swelling.  Gastrointestinal: Negative for abdominal pain, constipation and heartburn.  Musculoskeletal: Negative for back pain, joint pain and myalgias.  Skin: Negative.   Neurological: Negative for dizziness.  Psychiatric/Behavioral: The patient is not nervous/anxious.     Physical Exam Constitutional:      General: She is not in acute distress.    Appearance: She is well-developed. She is not diaphoretic.  Neck:     Thyroid: No thyromegaly.  Cardiovascular:     Rate and Rhythm: Normal rate and regular rhythm.     Heart sounds: Murmur present.     Comments: 3/6 Pulmonary:     Effort: Pulmonary effort is normal. No respiratory distress.     Breath sounds: Normal breath sounds.  Abdominal:     General: Bowel sounds are normal. There is no distension.     Palpations: Abdomen is soft.     Tenderness: There is no abdominal tenderness.  Musculoskeletal:     Right lower leg: Edema present.     Left lower leg: Edema present.     Comments: Trace bilateral lower extremity edema Is able to move all extremities    Lymphadenopathy:  Cervical: No cervical adenopathy.  Skin:    General: Skin is warm and dry.     Comments: Bilateral lower extremities discolored   Neurological:     Mental Status: She is alert. Mental status is at baseline.  Psychiatric:        Mood and Affect: Mood normal.      ASSESSMENT/ PLAN:  TODAY  1. Late onset alzheimer's disease: is without change weight is 147 (previous 145) pounds will monitor   2. Mixed hyperlipidemia: is stable lDl 153 will monitor   3. Hypokalemia is stable k+ 3.9 will continue k+ 40 meq daily   PREVIOUS   4. Weight loss non-intentional: weight in Jan 152 current weight is 147  pounds; will continue supplements as directed  5. Essential hypertension: is stable b/p 149/71: will continue to monitor her status  6. Cerebrovascular accident (cva) due to bilateral embolism of middle cerebral arteries: is neurologically stable will monitor     MD is aware of resident's narcotic use and is in agreement with current plan of care. We will attempt to wean resident as apropriate   Synthia Innocent NP [facility]  Contact [phone] Monday through Friday 8am- 5pm  After hours call [phone]

## 2019-01-03 ENCOUNTER — Encounter: Payer: Self-pay | Admitting: Adult Health

## 2019-01-03 ENCOUNTER — Non-Acute Institutional Stay (SKILLED_NURSING_FACILITY): Payer: Medicare Other | Admitting: Adult Health

## 2019-01-03 DIAGNOSIS — F0280 Dementia in other diseases classified elsewhere without behavioral disturbance: Secondary | ICD-10-CM | POA: Diagnosis not present

## 2019-01-03 DIAGNOSIS — G301 Alzheimer's disease with late onset: Secondary | ICD-10-CM | POA: Diagnosis not present

## 2019-01-03 DIAGNOSIS — I63413 Cerebral infarction due to embolism of bilateral middle cerebral arteries: Secondary | ICD-10-CM | POA: Diagnosis not present

## 2019-01-03 NOTE — Progress Notes (Signed)
Location:    [facility] Room Number: 142/D Place of Service:  SNF (31)   CODE STATUS: DNR  No Known Allergies  Chief Complaint  Patient presents with  . Acute Visit    Care Plan Meeting    HPI:  We have come together for her routine care plan meeting. Her weight has been stable. There are no reports of falls. She denies any uncontrolled pain. There are no reports of changes in appetite. No anxiety or depressive thoughts.   Past Medical History:  Diagnosis Date  . Diverticulitis   . Hyperlipemia   . Hypertension   . Macular degeneration   . OA (osteoarthritis)     Past Surgical History:  Procedure Laterality Date  . APPENDECTOMY    . TONSILLECTOMY AND ADENOIDECTOMY      Social History   Socioeconomic History  . Marital status: Widowed    Spouse name: Not on file  . Number of children: Not on file  . Years of education: 13 yrs   . Highest education level: Not on file  Occupational History  . Occupation: retired  Engineer, productionocial Needs  . Financial resource strain: Not on file  . Food insecurity:    Worry: Not on file    Inability: Not on file  . Transportation needs:    Medical: Not on file    Non-medical: Not on file  Tobacco Use  . Smoking status: Never Smoker  . Smokeless tobacco: Never Used  Substance and Sexual Activity  . Alcohol use: No  . Drug use: No  . Sexual activity: Never  Lifestyle  . Physical activity:    Days per week: Not on file    Minutes per session: Not on file  . Stress: Not on file  Relationships  . Social connections:    Talks on phone: Not on file    Gets together: Not on file    Attends religious service: Not on file    Active member of club or organization: Not on file    Attends meetings of clubs or organizations: Not on file    Relationship status: Not on file  . Intimate partner violence:    Fear of current or ex partner: Not on file    Emotionally abused: Not on file    Physically abused: Not on file     Forced sexual activity: Not on file  Other Topics Concern  . Not on file  Social History Narrative   Diet?       Do you drink/eat things with caffeine? yes      Marital status?          widowed                          What year were you married?      Do you live in a house, apartment, assisted living, condo, trailer, etc.? house      Is it one or more stories? One story ranch      How many persons live in your home? 2      Do you have any pets in your home? (please list) 3 cats      Current or past profession: school secretary      Do you exercise? no  Type & how often?      Do you have a living will? yes      Do you have a DNR form?  no                                If not, do you want to discuss one? yes      Do you have signed POA/HPOA for forms?  yes   Family History  Problem Relation Age of Onset  . Cancer Other        family history   . Arthritis Other        family history   . Heart attack Mother 57  . Stroke Father 34  . Macular degeneration Sister   . 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 aspirin EC 325 MG EC tablet Take 1 tablet (325 mg total) by mouth daily.  Lucilla Lame [place]-Castor Oil (VENELEX) OINT Apply to bilateral buttocks every shift prn for prevention  . NON FORMULARY Diet type:  Regular  . Nutritional Supplements (ENSURE ENLIVE PO) Give 120 ml by mouth two times daily between meals  . potassium chloride (K-DUR,KLOR-CON) 10 MEQ tablet Take 10 mEq by mouth daily.   No facility-administered encounter medications on file as of 01/03/2019.      SIGNIFICANT DIAGNOSTIC EXAMS   LABS REVIEWED PREVIOUS:   09-17-18: hgb a1c 4.9; chol 220;  ldl 153; trig 96; hdl 48 10-02-18: wbc 5.6; hgb 12.3; hct 39.6; mcv 99.5; plt 236; glucose 99; bun 17; creat 1.17; k+ 3.6; na++ 141; ca 8.6; liver normal albumin 3.2 10-09-18: glucose 93; bun 18; creat 1.02 ;k+ 3.9; na++140 ca 8.5  10-19-18: mag 2.1  NO NEW LABS.    Review of Systems  Constitutional: Negative for malaise/fatigue.  Respiratory: Negative for cough and shortness of breath.   Cardiovascular: Negative for chest pain, palpitations and leg swelling.  Gastrointestinal: Negative for abdominal pain, constipation and heartburn.  Musculoskeletal: Negative for back pain, joint pain and myalgias.  Skin: Negative.   Neurological: Negative for dizziness.  Psychiatric/Behavioral: The patient is not nervous/anxious.       Physical Exam Constitutional:      General: She is not in acute distress.    Appearance: She is well-developed. She is not diaphoretic.  Neck:     Thyroid: No thyromegaly.  Cardiovascular:     Rate and Rhythm: Normal rate and regular rhythm.     Pulses: Normal pulses.     Heart sounds: Murmur present.     Comments: 3/6 Pulmonary:     Effort: Pulmonary effort is normal. No respiratory distress.     Breath sounds: Normal breath sounds.  Abdominal:     General: Bowel sounds are normal. There is no distension.     Palpations: Abdomen is soft.     Tenderness: There is no abdominal tenderness.  Musculoskeletal:     Right lower leg: No edema.     Left lower leg: No edema.     Comments:  Trace bilateral lower extremity edema Is able to move all extremities     Lymphadenopathy:     Cervical: No cervical adenopathy.  Skin:    General: Skin is warm and dry.     Comments: Bilateral lower extremities discolored    Neurological:     Mental Status: She is alert. Mental status is at baseline.  Psychiatric:        Mood and Affect: Mood normal.     ASSESSMENT/ PLAN:  TODAY:   1. Late onset alzheimer's disease without behavioral disturbance 2. Cerebrovascular  accident (CVA) due to bilateral embolism of middle cerebral arteries.   Will continue current medications Will continue current plan of care Will continue to monitor her status.   MD is aware of resident's narcotic use and is in agreement with current plan of care. We will attempt to wean resident as apropriate   Synthia Innocent NP [facility]  Contact [phone] Monday through Friday 8am- 5pm  After hours call [phone]

## 2019-01-04 ENCOUNTER — Encounter: Payer: Self-pay | Admitting: Internal Medicine

## 2019-01-04 ENCOUNTER — Non-Acute Institutional Stay (SKILLED_NURSING_FACILITY): Payer: Medicare Other | Admitting: Internal Medicine

## 2019-01-04 DIAGNOSIS — I10 Essential (primary) hypertension: Secondary | ICD-10-CM | POA: Diagnosis not present

## 2019-01-04 DIAGNOSIS — G301 Alzheimer's disease with late onset: Secondary | ICD-10-CM | POA: Diagnosis not present

## 2019-01-04 DIAGNOSIS — I63413 Cerebral infarction due to embolism of bilateral middle cerebral arteries: Secondary | ICD-10-CM

## 2019-01-04 DIAGNOSIS — F0280 Dementia in other diseases classified elsewhere without behavioral disturbance: Secondary | ICD-10-CM

## 2019-01-04 NOTE — Progress Notes (Addendum)
Location:  [facility] Room Number: 32 D Place of Service:  SNF [phone]) Provider:  Einar Crow, MD  Kirt Boys, DO  Patient Care Team: Kirt Boys, DO as PCP - General (Internal Medicine)  Extended Emergency Contact Information Primary Emergency Contact: Gainey,Carol Address: [address]          [place], [place] [address] [place] Home Phone: [phone] Relation: Relative  Code Status:  DNR Goals of care: Advanced Directive information Advanced Directives 01/04/2019  Does Patient Have a Medical Advance Directive? Yes  Type of Estate agent of [place];Out of facility DNR (pink MOST or yellow form)  Does patient want to make changes to medical advance directive? No - Patient declined  Copy of Healthcare Power of Attorney in Chart? Yes - validated most recent copy scanned in chart (See row information)  Pre-existing out of facility DNR order (yellow form or pink MOST form) Yellow form placed in chart (order not valid for inpatient use)     Chief Complaint  Patient presents with  . Medical Management of Chronic Issues    Hypertension, Hypokalemia    HPI:  Pt is a 85 y.o. female seen today for medical management of chronic diseases.   Patient is in the facility for long-term care.  She was in the hospital in January for acute cardioembolic stroke.  She was admitted from ALF after she was noticed to have right facial droop and right-sided weakness.  MRI showed multiple scattered acute ischemic nonhemorrhagic infarct involving bilateral cerebral and cerebellar hemisphere and brainstem.  She her echo showed severe MR.  Her family refused further aggressive work-up or anticoagulation.  She was started on aspirin. Patient was unable to go back to her ALF facility and is now in SNF.  She has adjusted well and is doing good.  She stays wheelchair-bound stays at risk of falls.  Denies any chest pain, shortness of breath or  cough or fever.  Her appetite is good and her weight is stable.    Past Medical History:  Diagnosis Date  . Diverticulitis   . Hyperlipemia   . Hypertension   . Macular degeneration   . OA (osteoarthritis)    Past Surgical History:  Procedure Laterality Date  . APPENDECTOMY    . TONSILLECTOMY AND ADENOIDECTOMY      No Known Allergies  Outpatient Encounter Medications as of 01/04/2019  Medication Sig  . aspirin EC 325 MG EC tablet Take 1 tablet (325 mg total) by mouth daily.  Lucilla Lame [place]-Castor Oil (VENELEX) OINT Apply to bilateral buttocks every shift prn for prevention  . NON FORMULARY Diet type:  Regular  . Nutritional Supplements (ENSURE ENLIVE PO) Give 120 ml by mouth two times daily between meals  . potassium chloride (K-DUR,KLOR-CON) 10 MEQ tablet Take 10 mEq by mouth daily.   No facility-administered encounter medications on file as of 01/04/2019.     Review of Systems  Review of Systems  Constitutional: Negative for activity change, appetite change, chills, diaphoresis, fatigue and fever.  HENT: Negative for mouth sores, postnasal drip, rhinorrhea, sinus pain and sore throat.   Respiratory: Negative for apnea, cough, chest tightness, shortness of breath and wheezing.   Cardiovascular: Negative for chest pain, palpitations and leg swelling.  Gastrointestinal: Negative for abdominal distention, abdominal pain, constipation, diarrhea, nausea and vomiting.  Genitourinary: Negative for dysuria and frequency.  Musculoskeletal: Negative for arthralgias, joint swelling and myalgias.  Skin: Negative for rash.  Neurological: Negative  for dizziness, syncope, weakness, light-headedness and numbness.  Psychiatric/Behavioral: Negative for behavioral problems, confusion and sleep disturbance.     Immunization History  Administered Date(s) Administered  . Pneumococcal Polysaccharide-23 02/11/2017   Pertinent  Health Maintenance Due  Topic Date Due  . PNA vac Low Risk Adult  (2 of 2 - PCV13) 02/02/2019 (Originally 02/11/2018)  . INFLUENZA VACCINE  04/14/2019  . DEXA SCAN  Discontinued   Fall Risk  02/11/2017 07/02/2016 04/16/2016  Falls in the past year? No No No   Functional Status Survey:    Vitals:   01/04/19 0949  BP: (!) 148/75  Pulse: 70  Resp: 16  Temp: (!) 97 F (36.1 C)  Weight: 219 lb 12.8 oz (99.7 kg)  Height: 5\' 5"  (1.651 m)   Body mass index is 36.58 kg/m. Physical Exam Vitals signs reviewed.  Constitutional:      Appearance: Normal appearance.  HENT:     Head: Normocephalic.     Nose: Nose normal.     Mouth/Throat:     Mouth: Mucous membranes are moist.     Pharynx: Oropharynx is clear.  Eyes:     Pupils: Pupils are equal, round, and reactive to light.  Neck:     Musculoskeletal: Neck supple.  Cardiovascular:     Rate and Rhythm: Normal rate and regular rhythm.     Heart sounds: Murmur present.  Pulmonary:     Effort: Pulmonary effort is normal. No respiratory distress.     Breath sounds: Normal breath sounds. No wheezing or rales.  Abdominal:     General: Abdomen is flat. Bowel sounds are normal. There is no distension.     Tenderness: There is no abdominal tenderness. There is no guarding.  Musculoskeletal:     Comments: Mild edema Bilateral  Skin:    General: Skin is warm and dry.  Neurological:     Mental Status: She is alert.     Comments: No focal deficits on my Exam.  She is pleasantly confused. Does know her age  Psychiatric:        Mood and Affect: Mood normal.        Thought Content: Thought content normal.        Judgment: Judgment normal.     Labs reviewed: Recent Labs    09/29/18 0700 10/02/18 0659 10/09/18 0038 10/19/18 0700  NA 140 141 140  --   K 3.4* 3.6 3.9  --   CL 111 111 113*  --   CO2 21* 22 21*  --   GLUCOSE 93 99 93  --   BUN 15 17 18   --   CREATININE 1.02* 1.17* 1.02*  --   CALCIUM 8.4* 8.6* 8.5*  --   MG  --   --   --  2.1   Recent Labs    09/16/18 1807 09/18/18 0954  10/02/18 0659  AST 66* 42* 17  ALT 19 17 11   ALKPHOS 83 60 73  BILITOT 0.5 0.6 0.5  PROT 7.3 5.4* 6.2*  ALBUMIN 3.7 3.0* 3.2*   Recent Labs    09/16/18 1807  09/18/18 0954 09/29/18 0700 10/02/18 0659  WBC 14.8*  --  6.4 5.2 5.6  NEUTROABS 12.3*  --   --  3.3 3.6  HGB 14.9   < > 12.1 11.5* 12.3  HCT 45.2   < > 38.1 36.6 39.6  MCV 94.6  --  95.3 98.7 99.5  PLT 322  --  230 208 236   < > =  values in this interval not displayed.   Lab Results  Component Value Date   TSH 4.03 02/11/2017   Lab Results  Component Value Date   HGBA1C 4.9 09/17/2018   Lab Results  Component Value Date   CHOL 220 (H) 09/17/2018   HDL 48 09/17/2018   LDLCALC 153 (H) 09/17/2018   TRIG 96 09/17/2018   CHOLHDL 4.6 09/17/2018    Significant Diagnostic Results in last 30 days:  No results found.  Assessment/Plan  Cerebrovascular accident (CVA) due to bilateral embolism of middle cerebral arteries  Only on aspirin Family want any aggressive treatment.  Hyperlipidemia LDL high but Family refused Secondary prevention  Essential hypertension BP mildly high Not On any Meds Vascular dementia Patient has adjusted well to the facility. She continues to be wheelchair-bound due to gait abnormality and stays high risk for falls. Family/ staff Communication:   Labs/tests ordered:  Will discontinue Potassium and repeat BMP in 2 weeks Total time spent in this patient care encounter was  25_  minutes; greater than 50% of the visit spent counseling patient and staff, reviewing records , Labs and coordinating care for problems addressed at this encounter.

## 2019-01-18 ENCOUNTER — Encounter (HOSPITAL_COMMUNITY)
Admission: RE | Admit: 2019-01-18 | Discharge: 2019-01-18 | Disposition: A | Discharge status: Home / Self Care | Payer: Medicare Other | Source: Skilled Nursing Facility | Attending: Internal Medicine | Admitting: Internal Medicine

## 2019-01-18 DIAGNOSIS — I639 Cerebral infarction, unspecified: Secondary | ICD-10-CM | POA: Diagnosis not present

## 2019-01-18 DIAGNOSIS — I69322 Dysarthria following cerebral infarction: Secondary | ICD-10-CM | POA: Diagnosis present

## 2019-01-18 DIAGNOSIS — I69392 Facial weakness following cerebral infarction: Secondary | ICD-10-CM | POA: Diagnosis present

## 2019-01-18 LAB — BASIC METABOLIC PANEL
Anion gap: 10 (ref 5–15)
BUN: 27 mg/dL — ABNORMAL HIGH (ref 8–23)
CO2: 23 mmol/L (ref 22–32)
Calcium: 9.1 mg/dL (ref 8.9–10.3)
Chloride: 108 mmol/L (ref 98–111)
Creatinine, Ser: 1.4 mg/dL — ABNORMAL HIGH (ref 0.44–1.00)
GFR calc Af Amer: 37 mL/min — ABNORMAL LOW (ref >60)
GFR calc non Af Amer: 32 mL/min — ABNORMAL LOW (ref >60)
Glucose, Bld: 164 mg/dL — ABNORMAL HIGH (ref 70–99)
Potassium: 3.7 mmol/L (ref 3.5–5.1)
Sodium: 141 mmol/L (ref 135–145)

## 2019-02-06 ENCOUNTER — Encounter: Payer: Self-pay | Admitting: Adult Health

## 2019-02-06 ENCOUNTER — Non-Acute Institutional Stay (SKILLED_NURSING_FACILITY): Payer: Medicare Other | Admitting: Adult Health

## 2019-02-06 DIAGNOSIS — R634 Abnormal weight loss: Secondary | ICD-10-CM | POA: Diagnosis not present

## 2019-02-06 DIAGNOSIS — I63413 Cerebral infarction due to embolism of bilateral middle cerebral arteries: Secondary | ICD-10-CM

## 2019-02-06 DIAGNOSIS — I10 Essential (primary) hypertension: Secondary | ICD-10-CM | POA: Diagnosis not present

## 2019-02-06 NOTE — Progress Notes (Signed)
Location:   [facility] Room Number: [phone] D Place of Service:  SNF (31)   CODE STATUS: DNR  No Known Allergies  Chief Complaint  Patient presents with  . Medical Management of Chronic Issues    Cerebrovascular accident (CVA) due to bilateral embolism of middle cerebral arteries; essential hypertension; weight loss non-intentional.     HPI:  She is a 85 year old long term resident of this facility being seen for the management of her chronic illnesses; cva; hypertension; weight loss. She denies any pain; no anxiety or depressive thoughts; no changes in appetite. No reports of fevers.   Past Medical History:  Diagnosis Date  . Diverticulitis   . Hyperlipemia   . Hypertension   . Macular degeneration   . OA (osteoarthritis)     Past Surgical History:  Procedure Laterality Date  . APPENDECTOMY    . TONSILLECTOMY AND ADENOIDECTOMY      Social History   Socioeconomic History  . Marital status: Widowed    Spouse name: Not on file  . Number of children: Not on file  . Years of education: 13 yrs   . Highest education level: Not on file  Occupational History  . Occupation: retired  Engineer, production  . Financial resource strain: Not on file  . Food insecurity:    Worry: Not on file    Inability: Not on file  . Transportation needs:    Medical: Not on file    Non-medical: Not on file  Tobacco Use  . Smoking status: Never Smoker  . Smokeless tobacco: Never Used  Substance and Sexual Activity  . Alcohol use: No  . Drug use: No  . Sexual activity: Never  Lifestyle  . Physical activity:    Days per week: Not on file    Minutes per session: Not on file  . Stress: Not on file  Relationships  . Social connections:    Talks on phone: Not on file    Gets together: Not on file    Attends religious service: Not on file    Active member of club or organization: Not on file    Attends meetings of clubs or organizations: Not on file    Relationship  status: Not on file  . Intimate partner violence:    Fear of current or ex partner: Not on file    Emotionally abused: Not on file    Physically abused: Not on file    Forced sexual activity: Not on file  Other Topics Concern  . Not on file  Social History Narrative   Diet?       Do you drink/eat things with caffeine? yes      Marital status?          widowed                          What year were you married?      Do you live in a house, apartment, assisted living, condo, trailer, etc.? house      Is it one or more stories? One story ranch      How many persons live in your home? 2      Do you have any pets in your home? (please list) 3 cats      Current or past profession: school secretary      Do you exercise? no  Type & how often?      Do you have a living will? yes      Do you have a DNR form?  no                                If not, do you want to discuss one? yes      Do you have signed POA/HPOA for forms?  yes   Family History  Problem Relation Age of Onset  . Cancer Other        family history   . Arthritis Other        family history   . Heart attack Mother 35  . Stroke Father 76  . Macular degeneration Sister   . 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 aspirin EC 325 MG EC tablet Take 1 tablet (325 mg total) by mouth daily.  Lucilla Lame [place]-Castor Oil (VENELEX) OINT Apply to bilateral buttocks every shift prn for prevention  . NON FORMULARY Diet type:  Regular  . Nutritional Supplements (ENSURE ENLIVE PO) Give 120 ml by mouth two times daily between meals  . [DISCONTINUED] potassium chloride (K-DUR,KLOR-CON) 10 MEQ tablet Take 10 mEq by mouth daily.   No facility-administered  encounter medications on file as of 02/06/2019.      SIGNIFICANT DIAGNOSTIC EXAMS  LABS REVIEWED PREVIOUS:   09-17-18: hgb a1c 4.9; chol 220; ldl 153; trig 96; hdl 48 10-02-18: wbc 5.6; hgb 12.3; hct 39.6; mcv 99.5; plt 236; glucose 99; bun 17; creat 1.17; k+ 3.6; na++ 141; ca 8.6; liver normal albumin 3.2 10-09-18: glucose 93; bun 18; creat 1.02 ;k+ 3.9; na++140 ca 8.5  10-19-18: mag 2.1  TODAY:   01-18-19: glucose 164 bun 27; creat 1.40 k+ 3.7; na++ 141 ca 9.1    Review of Systems  Constitutional: Negative for malaise/fatigue.  Respiratory: Negative for cough and shortness of breath.   Cardiovascular: Negative for chest pain, palpitations and leg swelling.  Gastrointestinal: Negative for abdominal pain, constipation and heartburn.  Musculoskeletal: Negative for back pain, joint pain and myalgias.  Skin: Negative.   Neurological: Negative for dizziness.  Psychiatric/Behavioral: The patient is not nervous/anxious.     Physical Exam Constitutional:      General: She is not in acute distress.    Appearance: She is well-developed. She is not diaphoretic.  Neck:     Musculoskeletal: Neck supple.     Thyroid: No thyromegaly.  Cardiovascular:     Rate and Rhythm: Normal rate and regular rhythm.     Pulses: Normal pulses.     Heart sounds: Murmur present.     Comments: 3/6 Pulmonary:     Effort: Pulmonary effort is normal. No respiratory distress.     Breath sounds: Normal breath sounds.  Abdominal:     General: Bowel sounds are normal. There is no distension.     Palpations: Abdomen is soft.     Tenderness: There is no abdominal tenderness.  Musculoskeletal:     Right lower leg: Edema present.  Left lower leg: Edema present.     Comments: Trace bilateral lower extremity edema Is able to move all extremities   Lymphadenopathy:     Cervical: No cervical adenopathy.  Skin:    General: Skin is warm and dry.     Comments: Bilateral lower extremities discolored   Neurological:      Mental Status: She is alert. Mental status is at baseline.  Psychiatric:        Mood and Affect: Mood normal.     ASSESSMENT/ PLAN:  TODAY  1. Weight loss non-intentional: weight Jan 2020: 152 pounds current weight 147 pounds and stable will continue supplements as directed  2. Essential hypertension: is stable b/p 139/72 will continue to monitor   3. Cerebrovascular accident (CVA) due to bilateral embolism of middle cerebral arteries: is neurologically stable will continue asa 325 mg daily and will monitor   PREVIOUS   4. Late onset alzheimer's disease: is without change weight is 147 (previous 145) pounds will monitor   5. Mixed hyperlipidemia: is stable lDl 153 will monitor   6. Hypokalemia is stable k+ 3.7 is off k+ supplements will monitor     MD is aware of resident's narcotic use and is in agreement with current plan of care. We will attempt to wean resident as apropriate   Synthia Innocenteborah Welden Hausmann NP [facility]  Contact [phone] Monday through Friday 8am- 5pm  After hours call [phone]

## 2019-03-06 ENCOUNTER — Encounter: Payer: Self-pay | Admitting: Adult Health

## 2019-03-06 ENCOUNTER — Non-Acute Institutional Stay (SKILLED_NURSING_FACILITY): Payer: Medicare Other | Admitting: Adult Health

## 2019-03-06 DIAGNOSIS — E876 Hypokalemia: Secondary | ICD-10-CM

## 2019-03-06 DIAGNOSIS — F0280 Dementia in other diseases classified elsewhere without behavioral disturbance: Secondary | ICD-10-CM | POA: Diagnosis not present

## 2019-03-06 DIAGNOSIS — G301 Alzheimer's disease with late onset: Secondary | ICD-10-CM

## 2019-03-06 DIAGNOSIS — E782 Mixed hyperlipidemia: Secondary | ICD-10-CM | POA: Diagnosis not present

## 2019-03-06 NOTE — Progress Notes (Signed)
Location:   [facility] Room Number: [phone] D Place of Service:  SNF (31)   CODE STATUS: DNR  No Known Allergies  Chief Complaint  Patient presents with  . Medical Management of Chronic Issues       Late onset alzheimer's disease Mixed hyperlipidemia: Hypokalemia    HPI:  She is  85 year old long term resident of this facility being seen for the management of her chronic illnesses: dementia; hyperlipidemia; hypokalemia. She denies any uncontrolled pain; no changes in her appetite; no anxiety no insomnia.   Past Medical History:  Diagnosis Date  . Diverticulitis   . Hyperlipemia   . Hypertension   . Macular degeneration   . OA (osteoarthritis)     Past Surgical History:  Procedure Laterality Date  . APPENDECTOMY    . TONSILLECTOMY AND ADENOIDECTOMY      Social History   Socioeconomic History  . Marital status: Widowed    Spouse name: Not on file  . Number of children: Not on file  . Years of education: 13 yrs   . Highest education level: Not on file  Occupational History  . Occupation: retired  Engineer, productionocial Needs  . Financial resource strain: Not on file  . Food insecurity    Worry: Not on file    Inability: Not on file  . Transportation needs    Medical: Not on file    Non-medical: Not on file  Tobacco Use  . Smoking status: Never Smoker  . Smokeless tobacco: Never Used  Substance and Sexual Activity  . Alcohol use: No  . Drug use: No  . Sexual activity: Never  Lifestyle  . Physical activity    Days per week: Not on file    Minutes per session: Not on file  . Stress: Not on file  Relationships  . Social Musicianconnections    Talks on phone: Not on file    Gets together: Not on file    Attends religious service: Not on file    Active member of club or organization: Not on file    Attends meetings of clubs or organizations: Not on file    Relationship status: Not on file  . Intimate partner violence    Fear of current or ex partner:  Not on file    Emotionally abused: Not on file    Physically abused: Not on file    Forced sexual activity: Not on file  Other Topics Concern  . Not on file  Social History Narrative   Diet?       Do you drink/eat things with caffeine? yes      Marital status?          widowed                          What year were you married?      Do you live in a house, apartment, assisted living, condo, trailer, etc.? house      Is it one or more stories? One story ranch      How many persons live in your home? 2      Do you have any pets in your home? (please list) 3 cats      Current or past profession: school secretary      Do you exercise? no  Type & how often?      Do you have a living will? yes      Do you have a DNR form?  no                                If not, do you want to discuss one? yes      Do you have signed POA/HPOA for forms?  yes   Family History  Problem Relation Age of Onset  . Cancer Other        family history   . Arthritis Other        family history   . Heart attack Mother [id number]  . Stroke Father [id number]  . Macular degeneration Sister   . 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 aspirin EC 325 MG EC tablet Take 1 tablet (325 mg total) by mouth daily.  Lucilla Lame. Balsam [place]-Castor Oil (VENELEX) OINT Apply to bilateral buttocks every shift prn for prevention  . NON FORMULARY Diet type:  Regular  . Nutritional Supplements (ENSURE ENLIVE PO) Give 120 ml by mouth two times daily between meals  . UNABLE TO FIND Apply Xerofoam guaze and cover with dry dressing.  Check daily and change every 3 days and as needed   No facility-administered encounter medications on file as of 03/06/2019.       SIGNIFICANT DIAGNOSTIC EXAMS  LABS REVIEWED PREVIOUS:   09-17-18: hgb a1c 4.9; chol 220; ldl 153; trig 96; hdl 48 10-02-18: wbc 5.6; hgb 12.3; hct 39.6; mcv 99.5; plt 236; glucose 99; bun 17; creat 1.17; k+ 3.6; na++ 141; ca 8.6; liver normal albumin 3.2 10-09-18: glucose 93; bun 18; creat 1.02 ;k+ 3.9; na++140 ca 8.5  10-19-18: mag 2.1 01-18-19: glucose 164 bun 27; creat 1.40 k+ 3.7; na++ 141 ca 9.1  NO NEW LABS.    Review of Systems  Constitutional: Negative for malaise/fatigue.  Respiratory: Negative for cough and shortness of breath.   Cardiovascular: Negative for chest pain, palpitations and leg swelling.  Gastrointestinal: Negative for abdominal pain, constipation and heartburn.  Musculoskeletal: Negative for back pain, joint pain and myalgias.  Skin: Negative.   Neurological: Negative for dizziness.  Psychiatric/Behavioral: The patient is not nervous/anxious.      Physical Exam Constitutional:      General: She is not in acute distress.    Appearance: She is well-developed. She is not diaphoretic.  Neck:     Musculoskeletal: Neck supple.     Thyroid: No thyromegaly.  Cardiovascular:     Rate and Rhythm: Normal rate and regular rhythm.     Pulses: Normal pulses.     Heart sounds: Murmur present.     Comments: 3/6 Pulmonary:     Effort: Pulmonary effort is normal. No respiratory distress.     Breath sounds: Normal breath sounds.  Abdominal:     General: Bowel sounds are normal. There is no distension.     Palpations: Abdomen is soft.     Tenderness: There is no abdominal tenderness.  Musculoskeletal:  Right lower leg: No edema.     Left lower leg: No edema.     Comments: Trace bilateral lower extremity edema Is able to move all extremities    Lymphadenopathy:     Cervical: No cervical adenopathy.  Skin:    General: Skin is warm and dry.  Neurological:     Mental Status: She is alert. Mental status is at baseline.  Psychiatric:        Mood and Affect: Mood  normal.       ASSESSMENT/ PLAN:  TODAY  1. Late onset alzheimer's disease without change weight 142 (previous: 147, 145) pounds will monitor   2. Mixed hyperlipidemia: is stable ldl 153 will monitor   3. Hypokalemia: is stable k+ 3.7 is off supplements will monitor    PREVIOUS    4. Weight loss non-intentional: weight Jan 2020: 152 pounds current weight  142 (previous 147) pounds and stable will continue supplements as directed  5. Essential hypertension: is stable b/p 113/71 will continue to monitor   6. Cerebrovascular accident (CVA) due to bilateral embolism of middle cerebral arteries: is neurologically stable will continue asa 325 mg daily and will monitor      MD is aware of resident's narcotic use and is in agreement with current plan of care. We will attempt to wean resident as apropriate   Ok Edwards NP [facility]  Contact [phone] Monday through Friday 8am- 5pm  After hours call [phone]

## 2019-03-28 ENCOUNTER — Non-Acute Institutional Stay (SKILLED_NURSING_FACILITY): Payer: Medicare Other | Admitting: Adult Health

## 2019-03-28 ENCOUNTER — Encounter: Payer: Self-pay | Admitting: Adult Health

## 2019-03-28 DIAGNOSIS — G301 Alzheimer's disease with late onset: Secondary | ICD-10-CM | POA: Diagnosis not present

## 2019-03-28 DIAGNOSIS — I63413 Cerebral infarction due to embolism of bilateral middle cerebral arteries: Secondary | ICD-10-CM

## 2019-03-28 DIAGNOSIS — F0280 Dementia in other diseases classified elsewhere without behavioral disturbance: Secondary | ICD-10-CM

## 2019-03-28 DIAGNOSIS — F321 Major depressive disorder, single episode, moderate: Secondary | ICD-10-CM

## 2019-03-28 NOTE — Progress Notes (Signed)
Location:   [facility] Room Number: [id number] D Place of Service:  SNF (31)   CODE STATUS: DNR  No Known Allergies  Chief Complaint  Patient presents with  . Acute Visit    Care Plan Meeting    HPI:  We have come together for her care plan meeting: mood 5/30; unable to do BIMS. She is slowly losing weight down to 141 pounds her normal is 146-149 pounds. Her appetite is 25-100% of meals. She will decline nursing care at times. She does require increased assistance with her adl care. She is incontinent. She has recently lost her roommate. She denies any uncontrolled pain; no insomnia; no worsening fatigue.   Past Medical History:  Diagnosis Date  . Diverticulitis   . Hyperlipemia   . Hypertension   . Macular degeneration   . OA (osteoarthritis)     Past Surgical History:  Procedure Laterality Date  . APPENDECTOMY    . TONSILLECTOMY AND ADENOIDECTOMY      Social History   Socioeconomic History  . Marital status: Widowed    Spouse name: Not on file  . Number of children: Not on file  . Years of education: 13 yrs   . Highest education level: Not on file  Occupational History  . Occupation: retired  Engineer, productionocial Needs  . Financial resource strain: Not on file  . Food insecurity    Worry: Not on file    Inability: Not on file  . Transportation needs    Medical: Not on file    Non-medical: Not on file  Tobacco Use  . Smoking status: Never Smoker  . Smokeless tobacco: Never Used  Substance and Sexual Activity  . Alcohol use: No  . Drug use: No  . Sexual activity: Never  Lifestyle  . Physical activity    Days per week: Not on file    Minutes per session: Not on file  . Stress: Not on file  Relationships  . Social Musicianconnections    Talks on phone: Not on file    Gets together: Not on file    Attends religious service: Not on file    Active member of club or organization: Not on file    Attends meetings of clubs or organizations: Not on file    Relationship status: Not on file  . Intimate partner violence    Fear of current or ex partner: Not on file    Emotionally abused: Not on file    Physically abused: Not on file    Forced sexual activity: Not on file  Other Topics Concern  . Not on file  Social History Narrative   Diet?       Do you drink/eat things with caffeine? yes      Marital status?          widowed                          What year were you married?      Do you live in a house, apartment, assisted living, condo, trailer, etc.? house      Is it one or more stories? One story ranch      How many persons live in your home? 2      Do you have any pets in your home? (please list) 3 cats      Current or past profession: school secretary      Do you exercise?  no                                     Type & how often?      Do you have a living will? yes      Do you have a DNR form?  no                                If not, do you want to discuss one? yes      Do you have signed POA/HPOA for forms?  yes   Family History  Problem Relation Age of Onset  . Cancer Other        family history   . Arthritis Other        family history   . Heart attack Mother 50  . Stroke Father 81  . Macular degeneration Sister   . 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 aspirin EC 325 MG EC tablet Take 1 tablet (325 mg total) by mouth daily.  Roseanne Kaufman Peru-Castor Oil (VENELEX) OINT Apply to bilateral buttocks every shift prn for prevention  . NON FORMULARY Diet type:  Regular  . Nutritional Supplements (ENSURE ENLIVE PO) Give 120 ml by mouth two times daily between meals  . UNABLE TO FIND Apply Xerofoam guaze and cover with dry dressing.  Check daily and change every 3 days and as needed   No facility-administered encounter  medications on file as of 03/28/2019.      SIGNIFICANT DIAGNOSTIC EXAMS  LABS REVIEWED PREVIOUS:   09-17-18: hgb a1c 4.9; chol 220; ldl 153; trig 96; hdl 48 10-02-18: wbc 5.6; hgb 12.3; hct 39.6; mcv 99.5; plt 236; glucose 99; bun 17; creat 1.17; k+ 3.6; na++ 141; ca 8.6; liver normal albumin 3.2 10-09-18: glucose 93; bun 18; creat 1.02 ;k+ 3.9; na++140 ca 8.5  10-19-18: mag 2.1 01-18-19: glucose 164 bun 27; creat 1.40 k+ 3.7; na++ 141 ca 9.1  NO NEW LABS.    Review of Systems  Constitutional: Negative for malaise/fatigue.  Respiratory: Negative for cough and shortness of breath.   Cardiovascular: Negative for chest pain, palpitations and leg swelling.  Gastrointestinal: Negative for abdominal pain, constipation and heartburn.  Musculoskeletal: Negative for back pain, joint pain and myalgias.  Skin: Negative.   Neurological: Negative for dizziness.  Psychiatric/Behavioral: The patient is not nervous/anxious.      Physical Exam Constitutional:      General: She is not in acute distress.    Appearance: She is well-developed. She is not diaphoretic.  Neck:     Musculoskeletal: Neck supple.     Thyroid: No thyromegaly.  Cardiovascular:     Rate and Rhythm: Normal rate and regular rhythm.     Pulses: Normal pulses.     Heart sounds: Murmur present.     Comments: 3/6 Pulmonary:     Effort: Pulmonary effort is normal. No respiratory distress.     Breath sounds: Normal breath sounds.  Abdominal:     General: Bowel sounds are normal. There is no distension.  Palpations: Abdomen is soft.     Tenderness: There is no abdominal tenderness.  Musculoskeletal: Normal range of motion.     Right lower leg: No edema.     Left lower leg: No edema.  Lymphadenopathy:     Cervical: No cervical adenopathy.  Skin:    General: Skin is warm and dry.  Neurological:     Mental Status: She is alert. Mental status is at baseline.  Psychiatric:        Mood and Affect: Mood normal.         ASSESSMENT/ PLAN:  TODAY  1. Late onset alzheimer's disease without behavioral disturbance 2. Cerebrovascular accident (CVA) due to bilateral embolism of middle cerebral artery 3. Depression major single episode moderate  Will begin zoloft 25 mg daily  Will continue current plan of care Will continue to monitor her status.       MD is aware of resident's narcotic use and is in agreement with current plan of care. We will attempt to wean resident as apropriate   Synthia Innocenteborah  NP [facility]  Contact [phone] Monday through Friday 8am- 5pm  After hours call [phone]

## 2019-03-29 DIAGNOSIS — F321 Major depressive disorder, single episode, moderate: Secondary | ICD-10-CM | POA: Insufficient documentation

## 2019-04-04 ENCOUNTER — Non-Acute Institutional Stay (SKILLED_NURSING_FACILITY): Payer: Medicare Other | Admitting: Internal Medicine

## 2019-04-04 ENCOUNTER — Encounter: Payer: Self-pay | Admitting: Internal Medicine

## 2019-04-04 DIAGNOSIS — I63413 Cerebral infarction due to embolism of bilateral middle cerebral arteries: Secondary | ICD-10-CM

## 2019-04-04 DIAGNOSIS — F321 Major depressive disorder, single episode, moderate: Secondary | ICD-10-CM | POA: Diagnosis not present

## 2019-04-04 DIAGNOSIS — I10 Essential (primary) hypertension: Secondary | ICD-10-CM

## 2019-04-04 NOTE — Progress Notes (Signed)
Location:  [facility] Room Number: 142/D Place of Service:  SNF (31)  Margit HanksAlexander, Sarahlynn Cisnero D, MD  Patient Care Team: Margit HanksAlexander, Deacon Gadbois D, MD as PCP - General (Internal Medicine) Chilton SiGreen, Chong Sicilianeborah S, NP as Nurse Practitioner (Geriatric Medicine) [facility] (Skilled Nursing Facility)  Extended Emergency Contact Information Primary Emergency Contact: Kesselman,Carol Address: [address]          [place], [place] [address] [place] Home Phone: [phone] Relation: Relative    Allergies: Patient has no known allergies.  Chief Complaint  Patient presents with  . Medical Management of Chronic Issues    Routine visit of medical management  . Immunizations    T-Dap, Prevnar-13    HPI: Patient is 85 y.o. female who is being seen for routine issues of CVA, hypertension, and depression.  Past Medical History:  Diagnosis Date  . Diverticulitis   . Hyperlipemia   . Hypertension   . Macular degeneration   . OA (osteoarthritis)     Past Surgical History:  Procedure Laterality Date  . APPENDECTOMY    . TONSILLECTOMY AND ADENOIDECTOMY      Outpatient Encounter Medications as of 04/04/2019  Medication Sig  . aspirin EC 325 MG EC tablet Take 1 tablet (325 mg total) by mouth daily.  Lucilla Lame. Balsam [place]-Castor Oil (VENELEX) OINT Apply to bilateral buttocks every shift prn for prevention  . NON FORMULARY Diet type:  Regular  . Nutritional Supplements (ENSURE ENLIVE PO) Give 120 ml by mouth two times daily between meals  . sertraline (ZOLOFT) 25 MG tablet Take 25 mg by mouth daily.  Marland Kitchen. UNABLE TO FIND Apply Xerofoam guaze and cover with dry dressing.  Check daily and change every 3 days and as needed   No facility-administered encounter medications on file as of 04/04/2019.      No orders of the defined types were placed in this encounter.   Immunization History  Administered Date(s) Administered  . Pneumococcal Polysaccharide-23 02/11/2017     Social History   Tobacco Use  . Smoking status: Never Smoker  . Smokeless tobacco: Never Used  Substance Use Topics  . Alcohol use: No    Review of Systems  DATA OBTAINED: from patient, nurse GENERAL:  no fevers, fatigue, appetite changes SKIN: No itching, rash HEENT: No complaint RESPIRATORY: No cough, wheezing, SOB CARDIAC: No chest pain, palpitations, lower extremity edema  GI: No abdominal pain, No N/V/D or constipation, No heartburn or reflux  GU: No dysuria, frequency or urgency, or incontinence  MUSCULOSKELETAL: No unrelieved bone/joint pain NEUROLOGIC: No headache, dizziness  PSYCHIATRIC: No overt anxiety or sadness  Vitals:   04/04/19 1131  BP: 113/61  Pulse: 65  Resp: 20  Temp: (!) 97.5 F (36.4 C)  SpO2: 99%   Body mass index is 23.6 kg/m. Physical Exam  GENERAL APPEARANCE: Alert, conversant, No acute distress  SKIN: No diaphoresis rash HEENT: Unremarkable RESPIRATORY: Breathing is even, unlabored. Lung sounds are clear   CARDIOVASCULAR: Heart 4/6 systolic murmur, no  rubs or gallops. No peripheral edema  GASTROINTESTINAL: Abdomen is soft, non-tender, not distended w/ normal bowel sounds.  GENITOURINARY: Bladder non tender, not distended  MUSCULOSKELETAL: No abnormal joints or musculature NEUROLOGIC: Cranial nerves 2-12 grossly intact. Moves all extremities PSYCHIATRIC: Mood and affect appropriate to situation with dementia, no behavioral issues  Patient Active Problem List   Diagnosis Date Noted  . Depression, major, single episode, moderate (HCC) 03/29/2019  . Hypokalemia 11/03/2018  . Weight loss, non-intentional  11/03/2018  . CVA (cerebral vascular accident) ([place]) 09/17/2018  . HLD (hyperlipidemia) 09/17/2018  . HTN (hypertension) 09/17/2018  . Rhabdomyolysis 09/16/2018  . Abnormal chest x-ray 02/11/2017  . Dementia without behavioral disturbance ([place]) 04/16/2016  . Macular degeneration 04/16/2016    CMP     Component Value Date/Time    NA 141 01/18/2019 0751   K 3.7 01/18/2019 0751   CL 108 01/18/2019 0751   CO2 23 01/18/2019 0751   GLUCOSE 164 (H) 01/18/2019 0751   BUN 27 (H) 01/18/2019 0751   CREATININE 1.40 (H) 01/18/2019 0751   CREATININE 1.28 (H) 02/11/2017 1655   CALCIUM 9.1 01/18/2019 0751   PROT 6.2 (L) 10/02/2018 0659   ALBUMIN 3.2 (L) 10/02/2018 0659   AST 17 10/02/2018 0659   ALT 11 10/02/2018 0659   ALKPHOS 73 10/02/2018 0659   BILITOT 0.5 10/02/2018 0659   GFRNONAA 32 (L) 01/18/2019 0751   GFRNONAA 36 (L) 02/11/2017 1655   GFRAA 37 (L) 01/18/2019 0751   GFRAA 42 (L) 02/11/2017 1655   Recent Labs    10/02/18 0659 10/09/18 0038 10/19/18 0700 01/18/19 0751  NA 141 140  --  141  K 3.6 3.9  --  3.7  CL 111 113*  --  108  CO2 22 21*  --  23  GLUCOSE 99 93  --  164*  BUN 17 18  --  27*  CREATININE 1.17* 1.02*  --  1.40*  CALCIUM 8.6* 8.5*  --  9.1  MG  --   --  2.1  --    Recent Labs    09/16/18 1807 09/18/18 0954 10/02/18 0659  AST 66* 42* 17  ALT 19 17 11   ALKPHOS 83 60 73  BILITOT 0.5 0.6 0.5  PROT 7.3 5.4* 6.2*  ALBUMIN 3.7 3.0* 3.2*   Recent Labs    09/16/18 1807  09/18/18 0954 09/29/18 0700 10/02/18 0659  WBC 14.8*  --  6.4 5.2 5.6  NEUTROABS 12.3*  --   --  3.3 3.6  HGB 14.9   < > 12.1 11.5* 12.3  HCT 45.2   < > 38.1 36.6 39.6  MCV 94.6  --  95.3 98.7 99.5  PLT 322  --  230 208 236   < > = values in this interval not displayed.   Recent Labs    09/17/18 0344  CHOL 220*  LDLCALC 153*  TRIG 96   No results found for: [facility] Lab Results  Component Value Date   TSH 4.03 02/11/2017   Lab Results  Component Value Date   HGBA1C 4.9 09/17/2018   Lab Results  Component Value Date   CHOL 220 (H) 09/17/2018   HDL 48 09/17/2018   LDLCALC 153 (H) 09/17/2018   TRIG 96 09/17/2018   CHOLHDL 4.6 09/17/2018    Significant Diagnostic Results in last 30 days:  No results found.  Assessment and Plan  CVA (cerebral vascular accident) ([place]) No known recurrences;  stable; continue ASA 25 mg daily  HTN (hypertension) Stable on no medications; continue to monitor  Depression, major, single episode, moderate ([place]) Appears controlled; continue Zoloft 25 mg daily     Hennie Duos, MD

## 2019-04-07 ENCOUNTER — Encounter: Payer: Self-pay | Admitting: Internal Medicine

## 2019-04-07 NOTE — Assessment & Plan Note (Signed)
No known recurrences; stable; continue ASA 25 mg daily

## 2019-04-07 NOTE — Assessment & Plan Note (Signed)
Appears controlled; continue Zoloft 25 mg daily

## 2019-04-07 NOTE — Assessment & Plan Note (Signed)
Stable on no medications; continue to monitor

## 2019-05-01 ENCOUNTER — Encounter: Payer: Self-pay | Admitting: Adult Health

## 2019-05-01 ENCOUNTER — Non-Acute Institutional Stay (SKILLED_NURSING_FACILITY): Payer: Medicare Other | Admitting: Adult Health

## 2019-05-01 DIAGNOSIS — R634 Abnormal weight loss: Secondary | ICD-10-CM

## 2019-05-01 DIAGNOSIS — I10 Essential (primary) hypertension: Secondary | ICD-10-CM

## 2019-05-01 DIAGNOSIS — I63413 Cerebral infarction due to embolism of bilateral middle cerebral arteries: Secondary | ICD-10-CM

## 2019-05-01 NOTE — Progress Notes (Signed)
Location:   [place] Room Number: [phone] [id number] Place of Service:  SNF (31)   CODE STATUS: DNR  No Known Allergies  Chief Complaint  Patient presents with  . 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 There are no reports of changes in her appetite. She is slowly weight this past year which is an unfortunate outcome at the later stages of the dementia  process. No reports of uncontrolled pain; no agitation   Past Medical History:  Diagnosis Date  . Diverticulitis   . Hyperlipemia   . Hypertension   . Macular degeneration   . OA (osteoarthritis)     Past Surgical History:  Procedure Laterality Date  . APPENDECTOMY    . TONSILLECTOMY AND ADENOIDECTOMY      Social History   Socioeconomic History  . Marital status: Widowed    Spouse name: Not on file  . Number of children: Not on file  . Years of education: 13 yrs   . Highest education level: Not on file  Occupational History  . Occupation: retired  Scientific laboratory technician  . Financial resource strain: Not on file  . Food insecurity    Worry: Not on file    Inability: Not on file  . Transportation needs    Medical: Not on file    Non-medical: Not on file  Tobacco Use  . Smoking status: Never Smoker  . Smokeless tobacco: Never Used  Substance and Sexual Activity  . Alcohol use: No  . Drug use: No  . Sexual activity: Never  Lifestyle  . Physical activity    Days per week: Not on file    Minutes per session: Not on file  . Stress: Not on file  Relationships  . Social Herbalist on phone: Not on file    Gets together: Not on file    Attends religious service: Not on file    Active member of club  or organization: Not on file    Attends meetings of clubs or organizations: Not on file    Relationship status: Not on file  . Intimate partner violence    Fear of current or ex partner: Not on file    Emotionally abused: Not on file    Physically abused: Not on file    Forced sexual activity: Not on file  Other Topics Concern  . Not on file  Social History Narrative   Diet?       Do you drink/eat things with caffeine? yes      Marital status?          widowed                          What year were you married?      Do you live in a house, apartment, assisted living, condo, trailer, etc.? house      Is it one or more stories? One story ranch      How many persons live in your home? 2      Do you have any pets in your home? (please list) 3 cats  Current or past profession: Theme park managerschool secretary      Do you exercise? no                                     Type & how often?      Do you have a living will? yes      Do you have a DNR form?  no                                If not, do you want to discuss one? yes      Do you have signed POA/HPOA for forms?  yes   Family History  Problem Relation Age of Onset  . Cancer Other        family history   . Arthritis Other        family history   . Heart attack Mother [id number]  . Stroke Father 6595  . Macular degeneration Sister   . 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 aspirin EC 325 MG EC tablet Take 1 tablet (325 mg total) by mouth daily.  Lucilla Lame. Balsam [place]-Castor Oil (VENELEX) OINT Apply to bilateral buttocks every shift prn for prevention  . NON FORMULARY Diet type:  Regular  . Nutritional Supplements (ENSURE ENLIVE PO) Give 120 ml by mouth two times daily between meals  . sertraline  (ZOLOFT) 25 MG tablet Take 25 mg by mouth daily.  . [DISCONTINUED] UNABLE TO FIND Apply Xerofoam guaze and cover with dry dressing.  Check daily and change every 3 days and as needed   No facility-administered encounter medications on file as of 05/01/2019.      SIGNIFICANT DIAGNOSTIC EXAMS  LABS REVIEWED PREVIOUS:   09-17-18: hgb a1c 4.9; chol 220; ldl 153; trig 96; hdl 48 10-02-18: wbc 5.6; hgb 12.3; hct 39.6; mcv 99.5; plt 236; glucose 99; bun 17; creat 1.17; k+ 3.6; na++ 141; ca 8.6; liver normal albumin 3.2 10-09-18: glucose 93; bun 18; creat 1.02 ;k+ 3.9; na++140 ca 8.5  10-19-18: mag 2.1 01-18-19: glucose 164 bun 27; creat 1.40 k+ 3.7; na++ 141 ca 9.1  NO NEW LABS.   Review of Systems  Constitutional: Negative for malaise/fatigue.  Respiratory: Negative for cough and shortness of breath.   Cardiovascular: Negative for chest pain, palpitations and leg swelling.  Gastrointestinal: Negative for abdominal pain, constipation and heartburn.  Musculoskeletal: Negative for back pain, joint pain and myalgias.  Skin: Negative.   Neurological: Negative for dizziness.  Psychiatric/Behavioral: The patient is not nervous/anxious.     Physical Exam Constitutional:      General: She is not in acute distress.    Appearance: She is well-developed. She is not diaphoretic.  Neck:     Musculoskeletal: Neck supple.     Thyroid: No thyromegaly.  Cardiovascular:     Rate and Rhythm: Normal rate and regular rhythm.     Pulses: Normal pulses.     Heart sounds: Murmur present.  Comments: 3/6 Pulmonary:     Effort: Pulmonary effort is normal. No respiratory distress.     Breath sounds: Normal breath sounds.  Abdominal:     General: Bowel sounds are normal. There is no distension.     Palpations: Abdomen is soft.     Tenderness: There is no abdominal tenderness.  Musculoskeletal: Normal range of motion.     Right lower leg: No edema.     Left lower leg: No edema.  Lymphadenopathy:      Cervical: No cervical adenopathy.  Skin:    General: Skin is warm and dry.  Neurological:     Mental Status: She is alert. Mental status is at baseline.  Psychiatric:        Mood and Affect: Mood normal.       ASSESSMENT/ PLAN:  TODAY  1. Weight loss non-intentional wight Jan 2020: 152 pounds her current weight is 139 pounds; (previous 142, 147) no significant change in her weight will monitor  2. Essential hypertension: stable b/p 140/77 will continue to monitor her status.   3. Cerebrovascular accident (CVA) due to bilateral embolism of middle cerebral arteries: is stable will continue asa 325 mg daily   PREVIOUS    4. Late onset alzheimer's disease without change weight 139 (previous: 142 147, 145) pounds will monitor   5. Mixed hyperlipidemia: is stable ldl 153 will monitor   6. Hypokalemia: is stable k+ 3.7 is off supplements will monitor    MD is aware of resident's narcotic use and is in agreement with current plan of care. We will attempt to wean resident as apropriate   Synthia Innocenteborah Burdett Pinzon NP [facility]  Contact [phone] Monday through Friday 8am- 5pm  After hours call [phone]

## 2019-05-17 ENCOUNTER — Non-Acute Institutional Stay (SKILLED_NURSING_FACILITY): Payer: Medicare Other | Admitting: Adult Health

## 2019-05-17 DIAGNOSIS — G301 Alzheimer's disease with late onset: Secondary | ICD-10-CM | POA: Diagnosis not present

## 2019-05-17 DIAGNOSIS — F321 Major depressive disorder, single episode, moderate: Secondary | ICD-10-CM

## 2019-05-17 DIAGNOSIS — F0280 Dementia in other diseases classified elsewhere without behavioral disturbance: Secondary | ICD-10-CM

## 2019-05-20 ENCOUNTER — Encounter: Payer: Self-pay | Admitting: Adult Health

## 2019-05-20 NOTE — Progress Notes (Signed)
[facility] Room Number: 140 Place of Service:  SNF (31)   CODE STATUS: dnr  No Known Allergies  Chief Complaint  Patient presents with  . Acute Visit    psycho active medication review    HPI:  We have come together to review her zoloft 25 mg daily. There are no indications of depression or anxiety present. Her appetite is stable; her weight is stable. There are no reports of insomnia or anxiety present. She denies any depressive thoughts. We will attempt to stop this medication.   Past Medical History:  Diagnosis Date  . Diverticulitis   . Hyperlipemia   . Hypertension   . Macular degeneration   . OA (osteoarthritis)     Past Surgical History:  Procedure Laterality Date  . APPENDECTOMY    . TONSILLECTOMY AND ADENOIDECTOMY      Social History   Socioeconomic History  . Marital status: Widowed    Spouse name: Not on file  . Number of children: Not on file  . Years of education: 13 yrs   . Highest education level: Not on file  Occupational History  . Occupation: retired  Engineer, productionocial Needs  . Financial resource strain: Not on file  . Food insecurity    Worry: Not on file    Inability: Not on file  . Transportation needs    Medical: Not on file    Non-medical: Not on file  Tobacco Use  . Smoking status: Never Smoker  . Smokeless tobacco: Never Used  Substance and Sexual Activity  . Alcohol use: No  . Drug use: No  . Sexual activity: Never  Lifestyle  . Physical activity    Days per week: Not on file    Minutes per session: Not on file  . Stress: Not on file  Relationships  . Social Musicianconnections    Talks on phone: Not on file    Gets together: Not on file    Attends religious service: Not on file    Active member of club or organization: Not on file    Attends meetings of clubs or organizations: Not on file    Relationship status: Not on file  . Intimate partner violence    Fear of current or ex partner: Not on file   Emotionally abused: Not on file    Physically abused: Not on file    Forced sexual activity: Not on file  Other Topics Concern  . Not on file  Social History Narrative   Diet?       Do you drink/eat things with caffeine? yes      Marital status?          widowed                          What year were you married?      Do you live in a house, apartment, assisted living, condo, trailer, etc.? house      Is it one or more stories? One story ranch      How many persons live in your home? 2      Do you have any pets in your home? (please list) 3 cats      Current or past profession: school secretary      Do you exercise? no  Type & how often?      Do you have a living will? yes      Do you have a DNR form?  no                                If not, do you want to discuss one? yes      Do you have signed POA/HPOA for forms?  yes   Family History  Problem Relation Age of Onset  . Cancer Other        family history   . Arthritis Other        family history   . Heart attack Mother 15  . Stroke Father 79  . Macular degeneration Sister   . 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 aspirin EC 325 MG EC tablet Take 1 tablet (325 mg total) by mouth daily.  Lucilla Lame [place]-Castor Oil (VENELEX) OINT Apply to bilateral buttocks every shift prn for prevention  . NON FORMULARY Diet type:  Regular  . Nutritional Supplements (ENSURE ENLIVE PO) Give 120 ml by mouth two times daily between meals  . sertraline (ZOLOFT) 25 MG tablet Take 25 mg by mouth daily.   No facility-administered encounter medications on file as of 05/17/2019.      SIGNIFICANT DIAGNOSTIC EXAMS   LABS REVIEWED PREVIOUS:   09-17-18: hgb a1c 4.9; chol 220; ldl  153; trig 96; hdl 48 3-84-53: wbc 5.6; hgb 12.3; hct 39.6; mcv 99.5; plt 236; glucose 99; bun 17; creat 1.17; k+ 3.6; na++ 141; ca 8.6; liver normal albumin 3.2 10-09-18: glucose 93; bun 18; creat 1.02 ;k+ 3.9; na++140 ca 8.5  10-19-18: mag 2.1 01-18-19: glucose 164 bun 27; creat 1.40 k+ 3.7; na++ 141 ca 9.1  NO NEW LABS.    Review of Systems  Constitutional: Negative for malaise/fatigue.  Respiratory: Negative for cough and shortness of breath.   Cardiovascular: Negative for chest pain, palpitations and leg swelling.  Gastrointestinal: Negative for abdominal pain, constipation and heartburn.  Musculoskeletal: Negative for back pain, joint pain and myalgias.  Skin: Negative.   Neurological: Negative for dizziness.  Psychiatric/Behavioral: The patient is not nervous/anxious.      Physical Exam Constitutional:      General: She is not in acute distress.    Appearance: She is well-developed. She is not diaphoretic.  Neck:     Musculoskeletal: Neck supple.     Thyroid: No thyromegaly.  Cardiovascular:     Rate and Rhythm: Normal rate and regular rhythm.     Pulses: Normal pulses.     Heart sounds: Murmur present.     Comments: 3/6 Pulmonary:     Effort: Pulmonary effort is normal. No respiratory distress.     Breath sounds: Normal breath sounds.  Abdominal:     General: Bowel sounds are normal. There is no distension.     Palpations: Abdomen is soft.     Tenderness: There is no abdominal tenderness.  Musculoskeletal: Normal range of motion.     Right lower leg: No edema.     Left  lower leg: No edema.  Lymphadenopathy:     Cervical: No cervical adenopathy.  Skin:    General: Skin is warm and dry.  Neurological:     Mental Status: She is alert. Mental status is at baseline.  Psychiatric:        Mood and Affect: Mood normal.        ASSESSMENT/ PLAN:  TODAY:   1. Dementia without behavioral disturbance 2. Depression major single episode moderate   Will change her  zoloft to every other day through 05-22-19 then stop  Will continue to monitor his status.    MD is aware of resident's narcotic use and is in agreement with current plan of care. We will attempt to wean resident as appropriate.  Ok Edwards NP [facility]  Contact [phone] Monday through Friday 8am- 5pm  After hours call [phone]

## 2019-06-07 ENCOUNTER — Encounter: Payer: Self-pay | Admitting: Adult Health

## 2019-06-07 ENCOUNTER — Non-Acute Institutional Stay (SKILLED_NURSING_FACILITY): Payer: Medicare Other | Admitting: Adult Health

## 2019-06-07 DIAGNOSIS — G301 Alzheimer's disease with late onset: Secondary | ICD-10-CM | POA: Diagnosis not present

## 2019-06-07 DIAGNOSIS — E782 Mixed hyperlipidemia: Secondary | ICD-10-CM

## 2019-06-07 DIAGNOSIS — F321 Major depressive disorder, single episode, moderate: Secondary | ICD-10-CM

## 2019-06-07 DIAGNOSIS — F0280 Dementia in other diseases classified elsewhere without behavioral disturbance: Secondary | ICD-10-CM | POA: Diagnosis not present

## 2019-06-07 NOTE — Progress Notes (Signed)
Location:    [facility] Room Number: 142/D Place of Service:  SNF (31)   CODE STATUS: DNR  No Known Allergies  Chief Complaint  Patient presents with  . Medical Management of Chronic Issues        Depression major single episode moderate: . Late onset alzheimer's disease:  Marland Kitchen Mixed hyperlipidemia:     HPI:  She is a 85 year old long term resident of this facility being seen for the management of her chronic illnesses: depression; hyperlipidemia; dementia. Her weight has been stable at 138 pounds; there are no reports of changes in her appetite. There are no reports of of uncontrolled pain; no reports of anxiety or depressive thoughts. She is tolerating being off her zoloft at this time.   Past Medical History:  Diagnosis Date  . Dementia without behavioral disturbance (HCC) 04/16/2016   mild with MMSE 24/30 as of 04/16/16  . Diverticulitis   . Hyperlipemia   . Hypertension   . Macular degeneration   . OA (osteoarthritis)     Past Surgical History:  Procedure Laterality Date  . APPENDECTOMY    . TONSILLECTOMY AND ADENOIDECTOMY      Social History   Socioeconomic History  . Marital status: Widowed    Spouse name: Not on file  . Number of children: Not on file  . Years of education: 13 yrs   . Highest education level: Not on file  Occupational History  . Occupation: retired  Engineer, production  . Financial resource strain: Not on file  . Food insecurity    Worry: Not on file    Inability: Not on file  . Transportation needs    Medical: Not on file    Non-medical: Not on file  Tobacco Use  . Smoking status: Never Smoker  . Smokeless tobacco: Never Used  Substance and Sexual Activity  . Alcohol use: No  . Drug use: No  . Sexual activity: Never  Lifestyle  . Physical activity    Days per week: Not on file    Minutes per session: Not on file  . Stress: Not on file  Relationships  . Social Musician on phone: Not on file    Gets  together: Not on file    Attends religious service: Not on file    Active member of club or organization: Not on file    Attends meetings of clubs or organizations: Not on file    Relationship status: Not on file  . Intimate partner violence    Fear of current or ex partner: Not on file    Emotionally abused: Not on file    Physically abused: Not on file    Forced sexual activity: Not on file  Other Topics Concern  . Not on file  Social History Narrative   Diet?       Do you drink/eat things with caffeine? yes      Marital status?          widowed                          What year were you married?      Do you live in a house, apartment, assisted living, condo, trailer, etc.? house      Is it one or more stories? One story ranch      How many persons live in your home? 2  Do you have any pets in your home? (please list) 3 cats      Current or past profession: school secretary      Do you exercise? no                                     Type & how often?      Do you have a living will? yes      Do you have a DNR form?  no                                If not, do you want to discuss one? yes      Do you have signed POA/HPOA for forms?  yes   Family History  Problem Relation Age of Onset  . Cancer Other        family history   . Arthritis Other        family history   . Heart attack Mother 6086  . Stroke Father [id number]  . Macular degeneration Sister   . 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 aspirin EC 325 MG EC tablet Take 1 tablet (325 mg total) by mouth daily.  Lucilla Lame. Balsam Peru-Castor Oil (VENELEX) OINT Apply to bilateral buttocks every shift prn for prevention  . magnesium hydroxide (MILK OF MAGNESIA)  400 MG/5ML suspension Take 30 mLs by mouth daily as needed for mild constipation.  . NON FORMULARY Diet type:  Regular  . Nutritional Supplements (ENSURE ENLIVE PO) Give 120 ml by mouth two times daily between meals  . [DISCONTINUED] sertraline (ZOLOFT) 25 MG tablet Take 25 mg by mouth daily.   No facility-administered encounter medications on file as of 06/07/2019.      SIGNIFICANT DIAGNOSTIC EXAMS   LABS REVIEWED PREVIOUS:   09-17-18: hgb a1c 4.9; chol 220; ldl 153; trig 96; hdl 48 10-02-18: wbc 5.6; hgb 12.3; hct 39.6; mcv 99.5; plt 236; glucose 99; bun 17; creat 1.17; k+ 3.6; na++ 141; ca 8.6; liver normal albumin 3.2 10-09-18: glucose 93; bun 18; creat 1.02 ;k+ 3.9; na++140 ca 8.5  10-19-18: mag 2.1 01-18-19: glucose 164 bun 27; creat 1.40 k+ 3.7; na++ 141 ca 9.1  NO NEW LABS.    Review of Systems  Constitutional: Negative for malaise/fatigue.  Respiratory: Negative for cough and shortness of breath.   Cardiovascular: Negative for chest pain, palpitations and leg swelling.  Gastrointestinal: Negative for abdominal pain, constipation and heartburn.  Musculoskeletal: Negative for back pain, joint pain and myalgias.  Skin: Negative.   Neurological: Negative for dizziness.  Psychiatric/Behavioral: The patient is not nervous/anxious.     Physical Exam Constitutional:      General: She is not in acute distress.    Appearance: She is well-developed. She is not diaphoretic.  Neck:     Musculoskeletal: Neck supple.     Thyroid: No thyromegaly.  Cardiovascular:     Rate and Rhythm: Normal  rate and regular rhythm.     Pulses: Normal pulses.     Heart sounds: Murmur present.     Comments: 3/6 Pulmonary:     Effort: Pulmonary effort is normal. No respiratory distress.     Breath sounds: Normal breath sounds.  Abdominal:     General: Bowel sounds are normal. There is no distension.     Palpations: Abdomen is soft.     Tenderness: There is no abdominal tenderness.  Musculoskeletal:  Normal range of motion.     Right lower leg: No edema.     Left lower leg: No edema.  Lymphadenopathy:     Cervical: No cervical adenopathy.  Skin:    General: Skin is warm and dry.  Neurological:     Mental Status: She is alert. Mental status is at baseline.  Psychiatric:        Mood and Affect: Mood normal.     ASSESSMENT/ PLAN:  TODAY  1. Depression major single episode moderate: is stable is presently off Zoloft will monitor her status.   2. Late onset alzheimer's disease: without significant change weight is presently 138 pounds will monitor   3. Mixed hyperlipidemia: is stable LDL 153 will not make changes will monitor   PREVIOUS    4. Hypokalemia: is stable k+ 3.7 is off supplements will monitor   5. Weight loss non-intentional wight Jan 2020: 152 pounds her current weight is 138 pounds; no significant change in her weight will monitor  6. Essential hypertension: stable b/p 140/77 will continue to monitor her status.   7. Cerebrovascular accident (CVA) due to bilateral embolism of middle cerebral arteries: is stable will continue asa 325 mg daily    Will check cbc; cmp     MD is aware of resident's narcotic use and is in agreement with current plan of care. We will attempt to wean resident as appropriate.  Ok Edwards NP [facility]  Contact [phone] Monday through Friday 8am- 5pm  After hours call [phone]

## 2019-06-08 ENCOUNTER — Encounter (HOSPITAL_COMMUNITY)
Admission: RE | Admit: 2019-06-08 | Discharge: 2019-06-08 | Disposition: A | Discharge status: Home / Self Care | Payer: Medicare Other | Source: Skilled Nursing Facility | Attending: Adult Health | Admitting: Adult Health

## 2019-06-08 DIAGNOSIS — F329 Major depressive disorder, single episode, unspecified: Secondary | ICD-10-CM | POA: Diagnosis present

## 2019-06-08 DIAGNOSIS — I10 Essential (primary) hypertension: Secondary | ICD-10-CM | POA: Diagnosis not present

## 2019-06-08 DIAGNOSIS — I340 Nonrheumatic mitral (valve) insufficiency: Secondary | ICD-10-CM | POA: Diagnosis present

## 2019-06-08 DIAGNOSIS — G301 Alzheimer's disease with late onset: Secondary | ICD-10-CM | POA: Insufficient documentation

## 2019-06-08 DIAGNOSIS — I639 Cerebral infarction, unspecified: Secondary | ICD-10-CM | POA: Diagnosis present

## 2019-06-08 LAB — COMPREHENSIVE METABOLIC PANEL
ALT: 11 U/L (ref 0–44)
AST: 19 U/L (ref 15–41)
Albumin: 3.4 g/dL — ABNORMAL LOW (ref 3.5–5.0)
Alkaline Phosphatase: 52 U/L (ref 38–126)
Anion gap: 7 (ref 5–15)
BUN: 27 mg/dL — ABNORMAL HIGH (ref 8–23)
CO2: 24 mmol/L (ref 22–32)
Calcium: 8.8 mg/dL — ABNORMAL LOW (ref 8.9–10.3)
Chloride: 110 mmol/L (ref 98–111)
Creatinine, Ser: 1.2 mg/dL — ABNORMAL HIGH (ref 0.44–1.00)
GFR calc Af Amer: 44 mL/min — ABNORMAL LOW (ref >60)
GFR calc non Af Amer: 38 mL/min — ABNORMAL LOW (ref >60)
Glucose, Bld: 89 mg/dL (ref 70–99)
Potassium: 4.1 mmol/L (ref 3.5–5.1)
Sodium: 141 mmol/L (ref 135–145)
Total Bilirubin: 0.4 mg/dL (ref 0.3–1.2)
Total Protein: 6.3 g/dL — ABNORMAL LOW (ref 6.5–8.1)

## 2019-06-08 LAB — CBC
HCT: 39.3 % (ref 36.0–46.0)
Hemoglobin: 12 g/dL (ref 12.0–15.0)
MCH: 30.6 pg (ref 26.0–34.0)
MCHC: 30.5 g/dL (ref 30.0–36.0)
MCV: 100.3 fL — ABNORMAL HIGH (ref 80.0–100.0)
Platelets: 220 10*3/uL (ref 150–400)
RBC: 3.92 MIL/uL (ref 3.87–5.11)
RDW: 13.5 % (ref 11.5–15.5)
WBC: 6.2 10*3/uL (ref 4.0–10.5)
nRBC: 0 % (ref 0.0–0.2)

## 2019-07-04 ENCOUNTER — Other Ambulatory Visit: Payer: Self-pay | Admitting: Adult Health

## 2019-07-11 ENCOUNTER — Non-Acute Institutional Stay (SKILLED_NURSING_FACILITY): Payer: Medicare Other | Admitting: Internal Medicine

## 2019-07-11 ENCOUNTER — Encounter: Payer: Self-pay | Admitting: Internal Medicine

## 2019-07-11 DIAGNOSIS — E782 Mixed hyperlipidemia: Secondary | ICD-10-CM

## 2019-07-11 DIAGNOSIS — H3530 Unspecified macular degeneration: Secondary | ICD-10-CM | POA: Diagnosis not present

## 2019-07-11 DIAGNOSIS — F0280 Dementia in other diseases classified elsewhere without behavioral disturbance: Secondary | ICD-10-CM | POA: Diagnosis not present

## 2019-07-11 DIAGNOSIS — G301 Alzheimer's disease with late onset: Secondary | ICD-10-CM | POA: Diagnosis not present

## 2019-07-11 NOTE — Progress Notes (Signed)
Location:  [facility] Room Number: 142/D Place of Service:  SNF (31)  Hennie Duos, MD  Patient Care Team: Hennie Duos, MD as PCP - General (Internal Medicine) Nyoka Cowden Phylis Bougie, NP as Nurse Practitioner ([place]) [facility], [place] ([place])  Extended Emergency Contact Information Primary Emergency Contact: Bena,Carol Address: [place], [place] [address] [address]nette [place]itter [place] Phone: [phone] Relation: Relative    Allergies: Patient has no known allergies.  Chief Complaint  Patient presents with  . Medical Management of Chronic Issues    Routine visit of medical managment    HPI: Patient is 85 y.o. female who is being seen for routine issues of dementia, hyperlipidemia, and macular degeneration.  Past Medical History:  Diagnosis Date  . Dementia without behavioral disturbance ([place]) 04/16/2016   mild with MMSE 24/30 as of 04/16/16  . Diverticulitis   . Hyperlipemia   . Hypertension   . Macular degeneration   . OA (osteoarthritis)     Past Surgical History:  Procedure Laterality Date  . APPENDECTOMY    . TONSILLECTOMY AND ADENOIDECTOMY      Outpatient Encounter Medications as of 07/11/2019  Medication Sig  . aspirin EC 325 MG EC tablet Take 1 tablet (325 mg total) by mouth daily.  Roseanne Kaufman Peru-Castor Oil (VENELEX) OINT Apply to bilateral buttocks every shift prn for prevention  . magnesium hydroxide (MILK OF MAGNESIA) 400 MG/5ML suspension Take 30 mLs by mouth daily as needed for mild constipation.  . NON FORMULARY Diet type:  Regular  . Nutritional Supplements (ENSURE ENLIVE PO) Give 120 ml by mouth two times daily between meals   No facility-administered encounter medications on file as of 07/11/2019.     No orders of the defined types were placed in this encounter.   Immunization History  Administered Date(s) Administered  . Influenza-Unspecified 06/18/2019  .  Pneumococcal Conjugate-13 06/05/2019  . Pneumococcal Polysaccharide-23 02/11/2017  . Tdap 06/05/2019    Social History   Tobacco Use  . Smoking status: Never Smoker  . Smokeless tobacco: Never Used  Substance Use Topics  . Alcohol use: No    Review of Systems GENERAL:  no fevers, fatigue, appetite changes SKIN: No itching, rash HEENT: No complaint RESPIRATORY: No cough, wheezing, SOB CARDIAC: No chest pain, palpitations, lower extremity edema  GI: No abdominal pain, No N/V/D or constipation, No heartburn or reflux  GU: No dysuria, frequency or urgency, or incontinence  MUSCULOSKELETAL: No unrelieved bone/joint pain NEUROLOGIC: No headache, dizziness  PSYCHIATRIC: No overt anxiety or sadness  Vitals:   07/11/19 1020  BP: (!) 116/54  Pulse: 69  Resp: 16  Temp: (!) 97.2 F (36.2 C)  SpO2: 99%   Body mass index is 23.36 kg/m. Physical Exam  GENERAL APPEARANCE: Alert, conversant, No acute distress  SKIN: No diaphoresis rash HEENT: Unremarkable RESPIRATORY: Breathing is even, unlabored. Lung sounds are clear   CARDIOVASCULAR: Heart RRR no murmurs, rubs or gallops. No peripheral edema  GASTROINTESTINAL: Abdomen is soft, non-tender, not distended w/ normal bowel sounds.  GENITOURINARY: Bladder non tender, not distended  MUSCULOSKELETAL: No abnormal joints or musculature NEUROLOGIC: Cranial nerves 2-12 grossly intact. Moves all extremities PSYCHIATRIC: Mood and affect appropriate to situation with dementia, no behavioral issues  Patient Active Problem List   Diagnosis Date Noted  . Depression, major, single episode, moderate ([place]) 03/29/2019  . Hypokalemia 11/03/2018  . Weight loss, non-intentional 11/03/2018  .  CVA (cerebral vascular accident) (HCC) 09/17/2018  . HLD (hyperlipidemia) 09/17/2018  . HTN (hypertension) 09/17/2018  . Rhabdomyolysis 09/16/2018  . Abnormal chest x-ray 02/11/2017  . Dementia without behavioral disturbance (HCC) 04/16/2016  . Macular  degeneration 04/16/2016    CMP     Component Value Date/Time   NA 141 06/08/2019 0600   K 4.1 06/08/2019 0600   CL 110 06/08/2019 0600   CO2 24 06/08/2019 0600   GLUCOSE 89 06/08/2019 0600   BUN 27 (H) 06/08/2019 0600   CREATININE 1.20 (H) 06/08/2019 0600   CREATININE 1.28 (H) 02/11/2017 1655   CALCIUM 8.8 (L) 06/08/2019 0600   PROT 6.3 (L) 06/08/2019 0600   ALBUMIN 3.4 (L) 06/08/2019 0600   AST 19 06/08/2019 0600   ALT 11 06/08/2019 0600   ALKPHOS 52 06/08/2019 0600   BILITOT 0.4 06/08/2019 0600   GFRNONAA 38 (L) 06/08/2019 0600   GFRNONAA 36 (L) 02/11/2017 1655   GFRAA 44 (L) 06/08/2019 0600   GFRAA 42 (L) 02/11/2017 1655   Recent Labs    10/09/18 0038 10/19/18 0700 01/18/19 0751 06/08/19 0600  NA 140  --  141 141  K 3.9  --  3.7 4.1  CL 113*  --  108 110  CO2 21*  --  23 24  GLUCOSE 93  --  164* 89  BUN 18  --  27* 27*  CREATININE 1.02*  --  1.40* 1.20*  CALCIUM 8.5*  --  9.1 8.8*  MG  --  2.1  --   --    Recent Labs    09/18/18 0954 10/02/18 0659 06/08/19 0600  AST 42* 17 19  ALT 17 11 11   ALKPHOS 60 73 52  BILITOT 0.6 0.5 0.4  PROT 5.4* 6.2* 6.3*  ALBUMIN 3.0* 3.2* 3.4*   Recent Labs    09/16/18 1807  09/29/18 0700 10/02/18 0659 06/08/19 0600  WBC 14.8*   < > 5.2 5.6 6.2  NEUTROABS 12.3*  --  3.3 3.6  --   HGB 14.9   < > 11.5* 12.3 12.0  HCT 45.2   < > 36.6 39.6 39.3  MCV 94.6   < > 98.7 99.5 100.3*  PLT 322   < > 208 236 220   < > = values in this interval not displayed.   Recent Labs    09/17/18 0344  CHOL 220*  LDLCALC 153*  TRIG 96   No results found for: [facility] Lab Results  Component Value Date   TSH 4.03 02/11/2017   Lab Results  Component Value Date   HGBA1C 4.9 09/17/2018   Lab Results  Component Value Date   CHOL 220 (H) 09/17/2018   HDL 48 09/17/2018   LDLCALC 153 (H) 09/17/2018   TRIG 96 09/17/2018   CHOLHDL 4.6 09/17/2018    Significant Diagnostic Results in last 30 days:  No results found.   Assessment and Plan  Dementia without behavioral disturbance Adorable, on no medications; continue supportive care  HLD (hyperlipidemia) Patient had been on a statin but this was DC'd secondary to her age; continue supportive care  Macular degeneration Patient on no medications; patient has no complaints about vision; continue supportive care     11/16/2018, MD

## 2019-07-15 ENCOUNTER — Encounter: Payer: Self-pay | Admitting: Internal Medicine

## 2019-07-15 NOTE — Assessment & Plan Note (Signed)
Patient on no medications; patient has no complaints about vision; continue supportive care

## 2019-07-15 NOTE — Assessment & Plan Note (Signed)
Patient had been on a statin but this was DC'd secondary to her age; continue supportive care

## 2019-07-15 NOTE — Assessment & Plan Note (Signed)
Adorable, on no medications; continue supportive care

## 2019-08-01 ENCOUNTER — Encounter: Payer: Self-pay | Admitting: Adult Health

## 2019-08-01 ENCOUNTER — Non-Acute Institutional Stay (SKILLED_NURSING_FACILITY): Payer: Medicare Other | Admitting: Adult Health

## 2019-08-01 DIAGNOSIS — I63413 Cerebral infarction due to embolism of bilateral middle cerebral arteries: Secondary | ICD-10-CM

## 2019-08-01 DIAGNOSIS — F321 Major depressive disorder, single episode, moderate: Secondary | ICD-10-CM | POA: Diagnosis not present

## 2019-08-01 DIAGNOSIS — Z0000 Encounter for general adult medical examination without abnormal findings: Secondary | ICD-10-CM | POA: Diagnosis not present

## 2019-08-01 DIAGNOSIS — G301 Alzheimer's disease with late onset: Secondary | ICD-10-CM | POA: Diagnosis not present

## 2019-08-01 DIAGNOSIS — F0280 Dementia in other diseases classified elsewhere without behavioral disturbance: Secondary | ICD-10-CM

## 2019-08-01 NOTE — Progress Notes (Signed)
Location:  [facility] Room Number: 142-D Place of Service:  SNF (31) Provider:  Synthia Innocenteborah Green  Patient Care Team: Margit HanksAlexander, Anne D, MD as PCP - General (Internal Medicine) Chilton SiGreen, Chong Sicilianeborah S, NP as Nurse Practitioner (Geriatric Medicine) [facility])  Extended Emergency Contact Information Primary Emergency Contact: Brosky,Carol Address: [address]          [place], [place] [address] [place] Phone: [phone] Relation: Relative  Code Status: DNR Goals of Care: Advanced Directive information Advanced Directives 08/01/2019  Does Patient Have a Medical Advance Directive? Yes  Type of Advance Directive Out of facility DNR (pink MOST or yellow form)  Does patient want to make changes to medical advance directive? No - Patient declined  Copy of Healthcare Power of Attorney in Chart? -  Pre-existing out of facility DNR order (yellow form or pink MOST form) -     Chief Complaint  Patient presents with  . Medicare Wellness    Annual Wellness Visit    HPI: Patient is a 85 y.o. female seen in today for an annual wellness exam.  She has been hospitalized in Jan 2020 for cva. She has 3 falls this past year without injury. Her weight has remained stable. There are no reports of uncontrolled pain. No reports agitation or anxiety. She continues to be followed for her chronic illnesses including: dementia; cva; depression.   [facility] 08/01/2019 07/02/2016 04/16/2016  Decreased Interest 0 0 0  Down, Depressed, Hopeless 0 0 0  PHQ - 2 Score 0 0 0    Fall Risk  08/01/2019 02/11/2017 07/02/2016 04/16/2016  Falls in the past year? 1 No No No  Number falls in past yr: 1 - - -  Injury with Fall? 0 - - -  Risk for fall due to : History of fall(s);Impaired mobility - - -  Follow up Falls evaluation completed - - -   MMSE - Mini Mental State Exam 02/11/2017 04/16/2016  Orientation to time 4 2   Orientation to Place 3 5  Registration 3 3  Attention/ Calculation 5 5  Recall 0 3  Language- name 2 objects 2 2  Language- repeat 1 1  Language- follow 3 step command 3 1  Language- read & follow direction 1 1  Write a sentence 1 1  Copy design 1 0  Total score 24 24     Health Maintenance  Topic Date Due  . TETANUS/TDAP  06/04/2029  . INFLUENZA VACCINE  Completed  . PNA vac Low Risk Adult  Completed  . DEXA SCAN  Discontinued     Past Medical History:  Diagnosis Date  . Dementia without behavioral disturbance (HCC) 04/16/2016   mild with MMSE 24/30 as of 04/16/16  . Diverticulitis   . Hyperlipemia   . Hypertension   . Macular degeneration   . OA (osteoarthritis)     Past Surgical History:  Procedure Laterality Date  . APPENDECTOMY    . TONSILLECTOMY AND ADENOIDECTOMY      Family History  Problem Relation Age of Onset  . Cancer Other        family history   . Arthritis Other        family history   . Heart attack Mother [id number]  . Stroke Father [id number]  . Macular degeneration Sister   . 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 Marital status: Widowed    Spouse name: Not on file  . Number of children: Not on file  . Years of education: 13 yrs   . Highest education level: Not on file  Occupational History  . Occupation: retired  Scientific laboratory technician  . Financial resource strain: Not hard at all  . Food insecurity    Worry: Never true    Inability: Never true  . Transportation needs    Medical: No    Non-medical: No  Tobacco Use  . Smoking status: Never Smoker  . Smokeless tobacco: Never Used  Substance and Sexual Activity  . Alcohol use: No  . Drug use: No  . Sexual activity: Not Currently  Lifestyle  . Physical activity    Days per week: 0 days    Minutes per session: 0 min  . Stress: Not at all  Relationships  . Social Herbalist on phone: Once a week    Gets together:  Never    Attends religious service: Never    Active member of club or organization: No    Attends meetings of clubs or organizations: Never    Relationship status: Widowed  Other Topics Concern  . Not on file  Social History Narrative   Is a long term resident of [facility]     reports that she has never smoked. She has never used smokeless tobacco. She reports that she does not drink alcohol or use drugs.   No Known Allergies  Outpatient Encounter Medications as of 08/01/2019  Medication Sig  . aspirin EC 325 MG EC tablet Take 1 tablet (325 mg total) by mouth daily.  Roseanne Kaufman Peru-Castor Oil (VENELEX) OINT Apply to bilateral buttocks every shift prn for prevention  . magnesium hydroxide (MILK OF MAGNESIA) 400 MG/5ML suspension Take 30 mLs by mouth daily as needed for mild constipation.  . NON FORMULARY Diet type:  Regular  . Nutritional Supplements (ENSURE ENLIVE PO) Give 120 ml by mouth two times daily between meals   No facility-administered encounter medications on file as of 08/01/2019.      Review of Systems:  Review of Systems  Constitutional: Negative for appetite change and fatigue.  HENT: Negative for congestion.   Respiratory: Negative for cough, chest tightness and shortness of breath.   Cardiovascular: Negative for chest pain, palpitations and leg swelling.  Gastrointestinal: Negative for abdominal pain, constipation, diarrhea and nausea.  Endocrine: Negative for cold intolerance.  Musculoskeletal: Negative for arthralgias and myalgias.  Skin: Positive for pallor.  Neurological: Negative for dizziness.  Psychiatric/Behavioral: The patient is not nervous/anxious.     Physical Exam: Vitals:   08/01/19 1237  BP: (!) 116/54  Pulse: 69  Resp: 16  Temp: 97.7 F (36.5 C)  TempSrc: Oral  SpO2: 99%  Weight: 134 lb (60.8 kg)  Height: 5\' 5"  (1.651 m)   Body mass index is 22.3 kg/m. Physical Exam Constitutional:      General: She is not in acute distress.     Appearance: She is well-developed. She is not diaphoretic.  Neck:     Musculoskeletal: Neck supple.     Thyroid: No thyromegaly.  Cardiovascular:     Rate and Rhythm: Normal rate and regular rhythm.     Pulses: Normal pulses.     Heart sounds: Murmur present.     Comments: 3/6 Pulmonary:     Effort: Pulmonary effort is normal. No respiratory distress.  Breath sounds: Normal breath sounds.  Abdominal:     General: Bowel sounds are normal. There is no distension.     Palpations: Abdomen is soft.     Tenderness: There is no abdominal tenderness.  Musculoskeletal: Normal range of motion.     Right lower leg: No edema.     Left lower leg: No edema.  Lymphadenopathy:     Cervical: No cervical adenopathy.  Skin:    General: Skin is warm and dry.  Neurological:     Mental Status: She is alert. Mental status is at baseline.  Psychiatric:        Mood and Affect: Mood normal.       LABS REVIEWED PREVIOUS:   09-17-18: hgb a1c 4.9; chol 220; ldl 153; trig 96; hdl 48 10-02-18: wbc 5.6; hgb 12.3; hct 39.6; mcv 99.5; plt 236; glucose 99; bun 17; creat 1.17; k+ 3.6; na++ 141; ca 8.6; liver normal albumin 3.2 10-09-18: glucose 93; bun 18; creat 1.02 ;k+ 3.9; na++140 ca 8.5  10-19-18: mag 2.1 01-18-19: glucose 164 bun 27; creat 1.40 k+ 3.7; na++ 141 ca 9.1  TODAY'   06-08-19: wbc 6.2; hgb 12.0; hct 39.3; mcv 100.3 plt 220; glucose 89; bun 27; creat 1.20 ;k+ 4.1; na++ 141; ca 8.8; liver normal albumin 3.4       Assessment/Plan 1. Encounter for Medicare annual wellness exam 2. Late onset alzheimer's disease without behavioral disturbance 3. Cerebral vascular accident due to bilateral embolism of middle cerebral arteries 4. Depression major single episode moderate  Her health maintenance is up to date Will continue current medications Will continue current plan of care.  Will continue to monitor her status.

## 2019-08-01 NOTE — Progress Notes (Deleted)
Location:  [facility] Room Number: 142-D Place of Service:  SNF (31)   CODE STATUS: DNR  No Known Allergies  Chief Complaint  Patient presents with  . Medicare Wellness    Annual Wellness Visit    HPI:    Past Medical History:  Diagnosis Date  . Dementia without behavioral disturbance (HCC) 04/16/2016   mild with MMSE 24/30 as of 04/16/16  . Diverticulitis   . Hyperlipemia   . Hypertension   . Macular degeneration   . OA (osteoarthritis)     Past Surgical History:  Procedure Laterality Date  . APPENDECTOMY    . TONSILLECTOMY AND ADENOIDECTOMY      Social History   Socioeconomic History  . Marital status: Widowed    Spouse name: Not on file  . Number of children: Not on file  . Years of education: 13 yrs   . Highest education level: Not on file  Occupational History  . Occupation: retired  Engineer, production  . Financial resource strain: Not on file  . Food insecurity    Worry: Not on file    Inability: Not on file  . Transportation needs    Medical: Not on file    Non-medical: Not on file  Tobacco Use  . Smoking status: Never Smoker  . Smokeless tobacco: Never Used  Substance and Sexual Activity  . Alcohol use: No  . Drug use: No  . Sexual activity: Never  Lifestyle  . Physical activity    Days per week: Not on file    Minutes per session: Not on file  . Stress: Not on file  Relationships  . Social Musician on phone: Not on file    Gets together: Not on file    Attends religious service: Not on file    Active member of club or organization: Not on file    Attends meetings of clubs or organizations: Not on file    Relationship status: Not on file  . Intimate partner violence    Fear of current or ex partner: Not on file    Emotionally abused: Not on file    Physically abused: Not on file    Forced sexual activity: Not on file  Other Topics Concern  . Not on file  Social History Narrative   Diet?       Do you  drink/eat things with caffeine? yes      Marital status?          widowed                          What year were you married?      Do you live in a house, apartment, assisted living, condo, trailer, etc.? house      Is it one or more stories? One story ranch      How many persons live in your home? 2      Do you have any pets in your home? (please list) 3 cats      Current or past profession: school secretary      Do you exercise? no                                     Type & how often?      Do you have a living will? yes  Do you have a DNR form?  no                                If not, do you want to discuss one? yes      Do you have signed POA/HPOA for forms?  yes   Family History  Problem Relation Age of Onset  . Cancer Other        family history   . Arthritis Other        family history   . Heart attack Mother 39  . Stroke Father 67  . Macular degeneration Sister   . 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 aspirin EC 325 MG EC tablet Take 1 tablet (325 mg total) by mouth daily.  Roseanne Kaufman Peru-Castor Oil (VENELEX) OINT Apply to bilateral buttocks every shift prn for prevention  . magnesium hydroxide (MILK OF MAGNESIA) 400 MG/5ML suspension Take 30 mLs by mouth daily as needed for mild constipation.  . NON FORMULARY Diet type:  Regular  . Nutritional Supplements (ENSURE ENLIVE PO) Give 120 ml by mouth two times daily between meals   No facility-administered encounter medications on file as of 08/01/2019.      SIGNIFICANT DIAGNOSTIC EXAMS       ASSESSMENT/ PLAN:    MD is aware of resident's narcotic use and is in agreement with current plan of care. We will attempt to wean resident as  appropriate.  Ok Edwards NP [facility] Adult [facility]  Contact [phone] Monday through Friday 8am- 5pm  After hours call [phone]

## 2019-08-07 ENCOUNTER — Encounter: Payer: Self-pay | Admitting: Adult Health

## 2019-08-07 ENCOUNTER — Non-Acute Institutional Stay (SKILLED_NURSING_FACILITY): Payer: Medicare Other | Admitting: Adult Health

## 2019-08-07 DIAGNOSIS — R634 Abnormal weight loss: Secondary | ICD-10-CM | POA: Diagnosis not present

## 2019-08-07 DIAGNOSIS — E876 Hypokalemia: Secondary | ICD-10-CM

## 2019-08-07 DIAGNOSIS — I10 Essential (primary) hypertension: Secondary | ICD-10-CM | POA: Diagnosis not present

## 2019-08-07 NOTE — Progress Notes (Signed)
Location:    [facility] Room Number: 142/D Place of Service:  SNF (31)   CODE STATUS: DNR  No Known Allergies  Chief Complaint  Patient presents with  . Medical Management of Chronic Issues        Hypokalemia:   Weight loss non-intentional  Essential hypertension;     HPI:  She is a 85 year old long term resident of this facility being seen for the management of her chronic illnesses: hypokalemia; weight loss; hypertension. She denies any uncontrolled pain; no swelling in her legs; no changes in her appetite; no anxiety no insomnia.   Past Medical History:  Diagnosis Date  . Dementia without behavioral disturbance ([place]) 04/16/2016   mild with MMSE 24/30 as of 04/16/16  . Diverticulitis   . Hyperlipemia   . Hypertension   . Macular degeneration   . OA (osteoarthritis)     Past Surgical History:  Procedure Laterality Date  . APPENDECTOMY    . TONSILLECTOMY AND ADENOIDECTOMY      Social History   Socioeconomic History  . Marital status: Widowed    Spouse name: Not on file  . Number of children: Not on file  . Years of education: 13 yrs   . Highest education level: Not on file  Occupational History  . Occupation: retired  Scientific laboratory technician  . Financial resource strain: Not hard at all  . Food insecurity    Worry: Never true    Inability: Never true  . Transportation needs    Medical: No    Non-medical: No  Tobacco Use  . Smoking status: Never Smoker  . Smokeless tobacco: Never Used  Substance and Sexual Activity  . Alcohol use: No  . Drug use: No  . Sexual activity: Not Currently  Lifestyle  . Physical activity    Days per week: 0 days    Minutes per session: 0 min  . Stress: Not at all  Relationships  . Social Herbalist on phone: Once a week    Gets together: Never    Attends religious service: Never    Active member of club or organization: No    Attends meetings of clubs or organizations: Never    Relationship status:  Widowed  . Intimate partner violence    Fear of current or ex partner: No    Emotionally abused: No    Physically abused: No    Forced sexual activity: No  Other Topics Concern  . Not on file  Social History Narrative   Is a long term resident of [facility]    Family History  Problem Relation Age of Onset  . Cancer Other        family history   . Arthritis Other        family history   . Heart attack Mother 65  . Stroke Father 1  . Macular degeneration Sister   . 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 aspirin EC 325 MG EC tablet Take 1 tablet (325 mg total) by mouth daily.  Marland Kitchen  Balsam Peru-Castor Oil (VENELEX) OINT Apply to bilateral buttocks every shift prn for prevention  . magnesium hydroxide (MILK OF MAGNESIA) 400 MG/5ML suspension Take 30 mLs by mouth daily as needed for mild constipation.  . NON FORMULARY Diet type:  Regular  . Nutritional Supplements (ENSURE ENLIVE PO) Give 120 ml by mouth two times daily between meals   No facility-administered encounter medications on file as of 08/07/2019.      SIGNIFICANT DIAGNOSTIC EXAMS   LABS REVIEWED PREVIOUS:   09-17-18: hgb a1c 4.9; chol 220; ldl 153; trig 96; hdl 48 10-02-18: wbc 5.6; hgb 12.3; hct 39.6; mcv 99.5; plt 236; glucose 99; bun 17; creat 1.17; k+ 3.6; na++ 141; ca 8.6; liver normal albumin 3.2 10-09-18: glucose 93; bun 18; creat 1.02 ;k+ 3.9; na++140 ca 8.5  10-19-18: mag 2.1 01-18-19: glucose 164 bun 27; creat 1.40 k+ 3.7; na++ 141 ca 9.1  TODAY'   06-08-19: wbc 6.2; hgb 12.0; hct 39.3 mcv 100.3 plt 220; glucose 89; bun 27; creat 1.20; k+ 4.1; na++ 141; ca 8.8; liver normal albumin 3.4   Review of Systems  Constitutional: Negative for malaise/fatigue.   Respiratory: Negative for cough and shortness of breath.   Cardiovascular: Negative for chest pain, palpitations and leg swelling.  Gastrointestinal: Negative for abdominal pain, constipation and heartburn.  Musculoskeletal: Negative for back pain, joint pain and myalgias.  Skin: Negative.   Neurological: Negative for dizziness.  Psychiatric/Behavioral: The patient is not nervous/anxious.    Physical Exam Constitutional:      General: She is not in acute distress.    Appearance: She is well-developed. She is not diaphoretic.  Neck:     Musculoskeletal: Neck supple.     Thyroid: No thyromegaly.  Cardiovascular:     Rate and Rhythm: Normal rate and regular rhythm.     Pulses: Normal pulses.     Heart sounds: Murmur present.     Comments: 3/6 Pulmonary:     Effort: Pulmonary effort is normal. No respiratory distress.     Breath sounds: Normal breath sounds.  Abdominal:     General: Bowel sounds are normal. There is no distension.     Palpations: Abdomen is soft.     Tenderness: There is no abdominal tenderness.  Musculoskeletal: Normal range of motion.     Right lower leg: No edema.     Left lower leg: No edema.  Lymphadenopathy:     Cervical: No cervical adenopathy.  Skin:    General: Skin is warm and dry.  Neurological:     Mental Status: She is alert.  Psychiatric:        Mood and Affect: Mood normal.      ASSESSMENT/ PLAN:  TODAY  1. Hypokalemia: is stable k+ 4.1 will monitor   2. Weight loss non-intentional: weight Jan 2020 12 current weight is 139 is stable will continue supplements as directed and will monitor   3. Essential hypertension; is stable b/p 16/54 will continue to monitor    PREVIOUS    4. Cerebrovascular accident (CVA) due to bilateral embolism of middle cerebral arteries: is stable will continue asa 325 mg daily   5. Depression major single episode moderate: is stable is presently off Zoloft will monitor her status.   6. Late onset  alzheimer's disease: without significant change weight is presently 139 pounds will monitor   7. Mixed hyperlipidemia: is stable LDL 153 will not make changes will monitor      MD is aware of resident's narcotic  use and is in agreement with current plan of care. We will attempt to wean resident as appropriate.  Ok Edwards NP [facility] Adult Medicine  Contact [phone] Monday through Friday 8am- 5pm  After hours call [phone]

## 2019-08-19 ENCOUNTER — Other Ambulatory Visit (HOSPITAL_COMMUNITY)
Admission: RE | Admit: 2019-08-19 | Discharge: 2019-08-19 | Disposition: A | Discharge status: Home / Self Care | Payer: Medicare Other | Source: Ambulatory Visit | Attending: Internal Medicine | Admitting: Internal Medicine

## 2019-08-19 ENCOUNTER — Other Ambulatory Visit: Payer: Self-pay | Admitting: Internal Medicine

## 2019-08-19 DIAGNOSIS — Z9189 Other specified personal risk factors, not elsewhere classified: Secondary | ICD-10-CM

## 2019-08-19 DIAGNOSIS — Z20828 Contact with and (suspected) exposure to other viral communicable diseases: Secondary | ICD-10-CM | POA: Diagnosis present

## 2019-08-21 LAB — SARS CORONAVIRUS 2 (TAT 6-24 HRS): SARS Coronavirus 2: NEGATIVE

## 2019-08-25 ENCOUNTER — Other Ambulatory Visit: Payer: Self-pay | Admitting: Internal Medicine

## 2019-08-25 ENCOUNTER — Other Ambulatory Visit (HOSPITAL_COMMUNITY)
Admission: RE | Admit: 2019-08-25 | Discharge: 2019-08-25 | Disposition: A | Discharge status: Home / Self Care | Payer: Medicare Other | Source: Ambulatory Visit | Attending: Internal Medicine | Admitting: Internal Medicine

## 2019-08-25 DIAGNOSIS — Z20828 Contact with and (suspected) exposure to other viral communicable diseases: Secondary | ICD-10-CM | POA: Insufficient documentation

## 2019-08-25 DIAGNOSIS — Z9189 Other specified personal risk factors, not elsewhere classified: Secondary | ICD-10-CM

## 2019-08-25 LAB — SARS CORONAVIRUS 2 (TAT 6-24 HRS): SARS Coronavirus 2: NEGATIVE

## 2019-08-29 ENCOUNTER — Other Ambulatory Visit (HOSPITAL_COMMUNITY)
Admission: RE | Admit: 2019-08-29 | Discharge: 2019-08-29 | Disposition: A | Discharge status: Home / Self Care | Payer: Medicare Other | Source: Ambulatory Visit | Attending: Internal Medicine | Admitting: Internal Medicine

## 2019-08-29 DIAGNOSIS — Z20828 Contact with and (suspected) exposure to other viral communicable diseases: Secondary | ICD-10-CM | POA: Insufficient documentation

## 2019-08-29 DIAGNOSIS — Z9189 Other specified personal risk factors, not elsewhere classified: Secondary | ICD-10-CM | POA: Diagnosis present

## 2019-08-30 LAB — NOVEL CORONAVIRUS, NAA (HOSP ORDER, SEND-OUT TO REF LAB; TAT 18-24 HRS): SARS-CoV-2, NAA: NOT DETECTED

## 2019-09-03 ENCOUNTER — Other Ambulatory Visit (HOSPITAL_COMMUNITY)
Admission: RE | Admit: 2019-09-03 | Discharge: 2019-09-03 | Disposition: A | Discharge status: Home / Self Care | Payer: Medicare Other | Source: Ambulatory Visit | Attending: Internal Medicine | Admitting: Internal Medicine

## 2019-09-03 DIAGNOSIS — Z20828 Contact with and (suspected) exposure to other viral communicable diseases: Secondary | ICD-10-CM | POA: Insufficient documentation

## 2019-09-05 ENCOUNTER — Non-Acute Institutional Stay (SKILLED_NURSING_FACILITY): Payer: Medicare Other | Admitting: Adult Health

## 2019-09-05 ENCOUNTER — Encounter: Payer: Self-pay | Admitting: Adult Health

## 2019-09-05 DIAGNOSIS — I10 Essential (primary) hypertension: Secondary | ICD-10-CM | POA: Diagnosis not present

## 2019-09-05 DIAGNOSIS — F0280 Dementia in other diseases classified elsewhere without behavioral disturbance: Secondary | ICD-10-CM | POA: Diagnosis not present

## 2019-09-05 DIAGNOSIS — G301 Alzheimer's disease with late onset: Secondary | ICD-10-CM | POA: Diagnosis not present

## 2019-09-05 LAB — NOVEL CORONAVIRUS, NAA (HOSP ORDER, SEND-OUT TO REF LAB; TAT 18-24 HRS): SARS-CoV-2, NAA: NOT DETECTED

## 2019-09-05 NOTE — Progress Notes (Signed)
Location:    [facility] Room Number: 142/D Place of Service:  SNF (31)   CODE STATUS: DNR  No Known Allergies  Chief Complaint  Patient presents with  . Acute Visit     COVID Vaccine Permission    HPI:  We have the mederina vaccine. Her risk factors include: advanced age; resident of snf; hypertension; dementia. I have spoken with her family regarding the 2 injection process; effectiveness; side effects; and possible adverse reactions. Verbalized understanding and have agreed to the vaccine. There are no reports of uncontrolled pain; no changes in appetite; no agitation or anxiety.   Past Medical History:  Diagnosis Date  . Dementia without behavioral disturbance (HCC) 04/16/2016   mild with MMSE 24/30 as of 04/16/16  . Diverticulitis   . Hyperlipemia   . Hypertension   . Macular degeneration   . OA (osteoarthritis)     Past Surgical History:  Procedure Laterality Date  . APPENDECTOMY    . TONSILLECTOMY AND ADENOIDECTOMY      Social History   Socioeconomic History  . Marital status: Widowed    Spouse name: Not on file  . Number of children: Not on file  . Years of education: 13 yrs   . Highest education level: Not on file  Occupational History  . Occupation: retired  Tobacco Use  . Smoking status: Never Smoker  . Smokeless tobacco: Never Used  Substance and Sexual Activity  . Alcohol use: No  . Drug use: No  . Sexual activity: Not Currently  Other Topics Concern  . Not on file  Social History Narrative   Is a long term resident of [facility]    Social Determinants of Health   Financial Resource Strain: Low Risk   . Difficulty of Paying Living Expenses: Not hard at all  Food Insecurity: No Food Insecurity  . Worried About Programme researcher, broadcasting/film/video in the Last Year: Never true  . Ran Out of Food in the Last Year: Never true  Transportation Needs: No Transportation Needs  . Lack of Transportation (Medical): No  . Lack of Transportation  (Non-Medical): No  Physical Activity: Inactive  . Days of Exercise per Week: 0 days  . Minutes of Exercise per Session: 0 min  Stress: No Stress Concern Present  . Feeling of Stress : Not at all  Social Connections: Severely Isolated  . Frequency of Communication with Friends and Family: Once a week  . Frequency of Social Gatherings with Friends and Family: Never  . Attends Religious Services: Never  . Active Member of Clubs or Organizations: No  . Attends Banker Meetings: Never  . Marital Status: Widowed  Intimate Partner Violence: Not At Risk  . Fear of Current or Ex-Partner: No  . Emotionally Abused: No  . Physically Abused: No  . Sexually Abused: No   Family History  Problem Relation Age of Onset  . Cancer Other        family history   . Arthritis Other        family history   . Heart attack Mother 62  . Stroke Father 22  . Macular degeneration Sister   . 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 ascorbic acid (VITAMIN C) 500 MG tablet Take 500 mg by mouth daily.  Marland Kitchen aspirin EC 325 MG EC tablet Take 1 tablet (325 mg total) by mouth daily.  Roseanne Kaufman Peru-Castor Oil (VENELEX) OINT Apply to bilateral buttocks every shift prn for prevention  . magnesium hydroxide (MILK OF MAGNESIA) 400 MG/5ML suspension Take 30 mLs by mouth daily as needed for mild constipation.  . NON FORMULARY Diet type:  Regular  . Nutritional Supplements (ENSURE ENLIVE PO) Give 120 ml by mouth two times daily between meals  . zinc sulfate 220 (50 Zn) MG capsule Take 220 mg by mouth daily.   No facility-administered encounter medications on file as of 09/05/2019.     SIGNIFICANT DIAGNOSTIC EXAMS  LABS REVIEWED PREVIOUS:    09-17-18: hgb a1c 4.9; chol 220; ldl 153; trig 96; hdl 48 10-02-18: wbc 5.6; hgb 12.3; hct 39.6; mcv 99.5; plt 236; glucose 99; bun 17; creat 1.17; k+ 3.6; na++ 141; ca 8.6; liver normal albumin 3.2 10-09-18: glucose 93; bun 18; creat 1.02 ;k+ 3.9; na++140 ca 8.5  10-19-18: mag 2.1 01-18-19: glucose 164 bun 27; creat 1.40 k+ 3.7; na++ 141 ca 9.1 06-08-19: wbc 6.2; hgb 12.0; hct 39.3 mcv 100.3 plt 220; glucose 89; bun 27; creat 1.20; k+ 4.1; na++ 141; ca 8.8; liver normal albumin 3.4   NO NEW LABS.   Review of Systems  Constitutional: Negative for malaise/fatigue.  Respiratory: Negative for cough and shortness of breath.   Cardiovascular: Negative for chest pain, palpitations and leg swelling.  Gastrointestinal: Negative for abdominal pain, constipation and heartburn.  Musculoskeletal: Negative for back pain, joint pain and myalgias.  Skin: Negative.   Neurological: Negative for dizziness.  Psychiatric/Behavioral: The patient is not nervous/anxious.     Physical Exam Constitutional:      General: She is not in acute distress.    Appearance: She is well-developed. She is not diaphoretic.  Neck:     Thyroid: No thyromegaly.  Cardiovascular:     Rate and Rhythm: Normal rate and regular rhythm.     Pulses: Normal pulses.     Heart sounds: Murmur present.     Comments: 3/6 Pulmonary:     Effort: Pulmonary effort is normal. No respiratory distress.     Breath sounds: Normal breath sounds.  Abdominal:     General: Bowel sounds are normal. There is no distension.     Palpations: Abdomen is soft.     Tenderness: There is no abdominal tenderness.  Musculoskeletal:        General: Normal range of motion.     Cervical back: Neck supple.     Right lower leg: No edema.     Left lower leg: No edema.  Lymphadenopathy:     Cervical: No cervical adenopathy.  Skin:    General: Skin is warm and dry.  Neurological:     Mental Status: She is alert. Mental status is at baseline.  Psychiatric:         Mood and Affect: Mood normal.       ASSESSMENT/ PLAN:  TODAY  1. Advanced age 19. Long term resident of snf 3. Essential hypertension 3. Late onset alzheimer's disease  Will setup for the first injection on 09-11-19   MD is aware of resident's narcotic use and is in agreement with current plan of care. We will  attempt to wean resident as appropriate.  Ok Edwards NP [facility] Adult Medicine  Contact [phone] Monday through Friday 8am- 5pm  After hours call [phone]

## 2019-09-09 ENCOUNTER — Other Ambulatory Visit (HOSPITAL_COMMUNITY)
Admission: RE | Admit: 2019-09-09 | Discharge: 2019-09-09 | Disposition: A | Discharge status: Home / Self Care | Payer: Medicare Other | Source: Ambulatory Visit | Attending: Adult Health | Admitting: Adult Health

## 2019-09-09 DIAGNOSIS — Z20828 Contact with and (suspected) exposure to other viral communicable diseases: Secondary | ICD-10-CM | POA: Diagnosis present

## 2019-09-10 LAB — NOVEL CORONAVIRUS, NAA (HOSP ORDER, SEND-OUT TO REF LAB; TAT 18-24 HRS): SARS-CoV-2, NAA: NOT DETECTED

## 2019-09-12 ENCOUNTER — Non-Acute Institutional Stay (SKILLED_NURSING_FACILITY): Payer: Medicare Other | Admitting: Internal Medicine

## 2019-09-12 DIAGNOSIS — I10 Essential (primary) hypertension: Secondary | ICD-10-CM | POA: Diagnosis not present

## 2019-09-12 DIAGNOSIS — F321 Major depressive disorder, single episode, moderate: Secondary | ICD-10-CM

## 2019-09-12 DIAGNOSIS — I63413 Cerebral infarction due to embolism of bilateral middle cerebral arteries: Secondary | ICD-10-CM | POA: Diagnosis not present

## 2019-09-13 ENCOUNTER — Encounter: Payer: Self-pay | Admitting: Internal Medicine

## 2019-09-16 ENCOUNTER — Encounter: Payer: Self-pay | Admitting: Internal Medicine

## 2019-09-16 NOTE — Assessment & Plan Note (Signed)
Chronic and stable; continue ASA 325 mg daily

## 2019-09-16 NOTE — Progress Notes (Signed)
Location:  [facility] Room Number: 142-D Place of Service:  SNF (31)  Margit Hanks, MD  Patient Care Team: Margit Hanks, MD as PCP - General (Internal Medicine) Ch[place] Si Chong Sicilian, NP as Nurse Practitioner (Geriatric Medicine) Center, [facility] (Skilled Nursing Facility)  Extended Emergency Contact Information Primary Emergency Contact: Mcsorley,Carol Address: [address]          [place], [place] [address] [place] of [place] Home Phone: [phone] Relation: Relative    Allergies: Patient has no known allergies.  Chief Complaint  Patient presents with  . Medical Management of Chronic Issues    Routine [facility] SNF visit    HPI: Patient is 86 y.o. female who is being seen for routine issues of history of CVA, hypertension, and depression.  Past Medical History:  Diagnosis Date  . Dementia without behavioral disturbance (HCC) 04/16/2016   mild with MMSE 24/30 as of 04/16/16  . Diverticulitis   . Hyperlipemia   . Hypertension   . Macular degeneration   . OA (osteoarthritis)     Past Surgical History:  Procedure Laterality Date  . APPENDECTOMY    . TONSILLECTOMY AND ADENOIDECTOMY      Allergies as of 09/12/2019   No Known Allergies     Medication List    Notice   This visit is during an admission. Changes to the med list made in this visit will be reflected in the After Visit Summary of the admission.     No orders of the defined types were placed in this encounter.   Immunization History  Administered Date(s) Administered  . Influenza-Unspecified 06/18/2019  . Pneumococcal Conjugate-13 06/05/2019  . Pneumococcal Polysaccharide-23 02/11/2017  . Tdap 06/05/2019    Social History   Tobacco Use  . Smoking status: Never Smoker  . Smokeless tobacco: Never Used  Substance Use Topics  . Alcohol use: No    Review of Systems   GENERAL:  no fevers, fatigue, appetite changes SKIN: No itching, rash HEENT:  No complaint RESPIRATORY: No cough, wheezing, SOB CARDIAC: No chest pain, palpitations, lower extremity edema  GI: No abdominal pain, No N/V/D or constipation, No heartburn or reflux  GU: No dysuria, frequency or urgency, or incontinence  MUSCULOSKELETAL: No unrelieved bone/joint pain NEUROLOGIC: No headache, dizziness  PSYCHIATRIC: No overt anxiety or sadness  Vitals:   09/12/19 1033  BP: (!) 116/54  Pulse: 69  Resp: 16  Temp: (!) 97.4 F (36.3 C)  SpO2: 99%   Body mass index is 22.96 kg/m. Physical Exam  GENERAL APPEARANCE: Alert, conversant, No acute distress  SKIN: No diaphoresis rash HEENT: Unremarkable RESPIRATORY: Breathing is even, unlabored. Lung sounds are clear   CARDIOVASCULAR: Heart RRR no murmurs, rubs or gallops. No peripheral edema  GASTROINTESTINAL: Abdomen is soft, non-tender, not distended w/ normal bowel sounds.  GENITOURINARY: Bladder non tender, not distended  MUSCULOSKELETAL: No abnormal joints or musculature NEUROLOGIC: Cranial nerves 2-12 grossly intact. Moves all extremities PSYCHIATRIC: Mood and affect appropriate to situation with dementia, no behavioral issues  Patient Active Problem List   Diagnosis Date Noted  . Depression, major, single episode, moderate (HCC) 03/29/2019  . Hypokalemia 11/03/2018  . Weight loss, non-intentional 11/03/2018  . CVA (cerebral vascular accident) (HCC) 09/17/2018  . HLD (hyperlipidemia) 09/17/2018  . HTN (hypertension) 09/17/2018  . Dementia without behavioral disturbance (HCC) 04/16/2016  . Macular degeneration 04/16/2016    CMP     Component Value Date/Time   NA 141 06/08/2019 0600  K 4.1 06/08/2019 0600   CL 110 06/08/2019 0600   CO2 24 06/08/2019 0600   GLUCOSE 89 06/08/2019 0600   BUN 27 (H) 06/08/2019 0600   CREATININE 1.20 (H) 06/08/2019 0600   CREATININE 1.28 (H) 02/11/2017 1655   CALCIUM 8.8 (L) 06/08/2019 0600   PROT 6.3 (L) 06/08/2019 0600   ALBUMIN 3.4 (L) 06/08/2019 0600   AST 19  06/08/2019 0600   ALT 11 06/08/2019 0600   ALKPHOS 52 06/08/2019 0600   BILITOT 0.4 06/08/2019 0600   GFRNONAA 38 (L) 06/08/2019 0600   GFRNONAA 36 (L) 02/11/2017 1655   GFRAA 44 (L) 06/08/2019 0600   GFRAA 42 (L) 02/11/2017 1655   Recent Labs    10/09/18 0038 10/19/18 0700 01/18/19 0751 06/08/19 0600  NA 140  --  141 141  K 3.9  --  3.7 4.1  CL 113*  --  108 110  CO2 21*  --  23 24  GLUCOSE 93  --  164* 89  BUN 18  --  27* 27*  CREATININE 1.02*  --  1.40* 1.20*  CALCIUM 8.5*  --  9.1 8.8*  MG  --  2.1  --   --    Recent Labs    09/18/18 0954 10/02/18 0659 06/08/19 0600  AST 42* 17 19  ALT 17 11 11   ALKPHOS 60 73 52  BILITOT 0.6 0.5 0.4  PROT 5.4* 6.2* 6.3*  ALBUMIN 3.0* 3.2* 3.4*   Recent Labs    09/29/18 0700 10/02/18 0659 06/08/19 0600  WBC 5.2 5.6 6.2  NEUTROABS 3.3 3.6  --   HGB 11.5* 12.3 12.0  HCT 36.6 39.6 39.3  MCV 98.7 99.5 100.3*  PLT 208 236 220   Recent Labs    09/17/18 0344  CHOL 220*  LDLCALC 153*  TRIG 96   No results found for: [facility] Lab Results  Component Value Date   TSH 4.03 02/11/2017   Lab Results  Component Value Date   HGBA1C 4.9 09/17/2018   Lab Results  Component Value Date   CHOL 220 (H) 09/17/2018   HDL 48 09/17/2018   LDLCALC 153 (H) 09/17/2018   TRIG 96 09/17/2018   CHOLHDL 4.6 09/17/2018    Significant Diagnostic Results in last 30 days:  No results found.  Assessment and Plan  CVA (cerebral vascular accident) ([place]) Chronic and stable; continue ASA 325 mg daily  HTN (hypertension) Chronic and stable on no medications; continue to monitor  Depression, major, single episode, moderate (HCC) Stable; continue Zoloft 25 mg daily     Hennie Duos, MD

## 2019-09-16 NOTE — Assessment & Plan Note (Signed)
Chronic and stable on no medications; continue to monitor

## 2019-09-16 NOTE — Assessment & Plan Note (Signed)
Stable; continue Zoloft 25 mg daily

## 2019-09-19 DIAGNOSIS — I639 Cerebral infarction, unspecified: Secondary | ICD-10-CM | POA: Diagnosis not present

## 2019-09-19 DIAGNOSIS — Z1159 Encounter for screening for other viral diseases: Secondary | ICD-10-CM | POA: Diagnosis not present

## 2019-09-26 ENCOUNTER — Non-Acute Institutional Stay (SKILLED_NURSING_FACILITY): Payer: Medicare PPO | Admitting: Adult Health

## 2019-09-26 ENCOUNTER — Encounter: Payer: Self-pay | Admitting: Adult Health

## 2019-09-26 DIAGNOSIS — F0280 Dementia in other diseases classified elsewhere without behavioral disturbance: Secondary | ICD-10-CM | POA: Diagnosis not present

## 2019-09-26 DIAGNOSIS — I10 Essential (primary) hypertension: Secondary | ICD-10-CM | POA: Diagnosis not present

## 2019-09-26 DIAGNOSIS — G301 Alzheimer's disease with late onset: Secondary | ICD-10-CM | POA: Diagnosis not present

## 2019-09-26 DIAGNOSIS — F321 Major depressive disorder, single episode, moderate: Secondary | ICD-10-CM | POA: Diagnosis not present

## 2019-09-26 NOTE — Progress Notes (Signed)
Location:    [place] Room Number: 142D Place of Service:  SNF (31) Phillips Grout NP    CODE STATUS: DNR  No Known Allergies  Chief Complaint  Patient presents with  . Acute Visit    Care Plan Meeting    HPI:  We have come together for her care plan meeting. BIMS 7/15 mood 0/30. She has not had a fall in the past year. Her weight is stable. There are no reports of uncontrolled pain; no changes in appetite; no agitation or anxiety. She continues to be followed for her chronic illnesses including: depression; dementia; hypertension   Past Medical History:  Diagnosis Date  . Dementia without behavioral disturbance ([place]) 04/16/2016   mild with MMSE 24/30 as of 04/16/16  . Diverticulitis   . Hyperlipemia   . Hypertension   . Macular degeneration   . OA (osteoarthritis)     Past Surgical History:  Procedure Laterality Date  . APPENDECTOMY    . TONSILLECTOMY AND ADENOIDECTOMY      Social History   Socioeconomic History  . Marital status: Widowed    Spouse name: Not on file  . Number of children: Not on file  . Years of education: 13 yrs   . Highest education level: Not on file  Occupational History  . Occupation: retired  Tobacco Use  . Smoking status: Never Smoker  . Smokeless tobacco: Never Used  Substance and Sexual Activity  . Alcohol use: No  . Drug use: No  . Sexual activity: Not Currently  Other Topics Concern  . Not on file  Social History Narrative   Is a long term resident of [facility]    Social Determinants of Health   Financial Resource Strain: Low Risk   . Difficulty of Paying Living Expenses: Not hard at all  Food Insecurity: No Food Insecurity  . Worried About Charity fundraiser in the Last Year: Never true  . Ran Out of Food in the Last Year: Never true  Transportation Needs: No Transportation Needs  . Lack of Transportation (Medical): No  . Lack of Transportation (Non-Medical): No  Physical Activity: Inactive  . Days of  Exercise per Week: 0 days  . Minutes of Exercise per Session: 0 min  Stress: No Stress Concern Present  . Feeling of Stress : Not at all  Social Connections: Severely Isolated  . Frequency of Communication with Friends and Family: Once a week  . Frequency of Social Gatherings with Friends and Family: Never  . Attends Religious Services: Never  . Active Member of Clubs or Organizations: No  . Attends Archivist Meetings: Never  . Marital Status: Widowed  Intimate Partner Violence: Not At Risk  . Fear of Current or Ex-Partner: No  . Emotionally Abused: No  . Physically Abused: No  . Sexually Abused: No   Family History  Problem Relation Age of Onset  . Cancer Other        family history   . Arthritis Other        family history   . Heart attack Mother 48  . Stroke Father 36  . Macular degeneration Sister   . 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 aspirin EC 325 MG EC tablet Take 1 tablet (325 mg total) by mouth daily.  Lucilla Lame [place]-Castor Oil (VENELEX) OINT Apply to sacrum and bilateral buttocks qshift & prn for prevention.  . magnesium hydroxide (MILK OF MAGNESIA) 400 MG/5ML suspension Take 30 mLs by mouth daily as needed for mild constipation.  . NON FORMULARY Diet type:  Regular  . Nutritional Supplements (ENSURE ENLIVE PO) Give 120 ml by mouth two times daily between meals   No facility-administered encounter medications on file as of 09/26/2019.     SIGNIFICANT DIAGNOSTIC EXAMS  LABS REVIEWED PREVIOUS:   09-17-18: hgb a1c 4.9; chol 220; ldl 153; trig 96; hdl 48 10-02-18: wbc 5.6; hgb 12.3; hct 39.6; mcv 99.5; plt 236; glucose 99; bun 17; creat 1.17; k+ 3.6; na++ 141; ca 8.6; liver  normal albumin 3.2 10-09-18: glucose 93; bun 18; creat 1.02 ;k+ 3.9; na++140 ca 8.5  10-19-18: mag 2.1 01-18-19: glucose 164 bun 27; creat 1.40 k+ 3.7; na++ 141 ca 9.1 06-08-19: wbc 6.2; hgb 12.0; hct 39.3 mcv 100.3 plt 220; glucose 89; bun 27; creat 1.20; k+ 4.1; na++ 141; ca 8.8; liver normal albumin 3.4   NO NEW LABS.   Review of Systems  Constitutional: Negative for malaise/fatigue.  Respiratory: Negative for cough and shortness of breath.   Cardiovascular: Negative for chest pain, palpitations and leg swelling.  Gastrointestinal: Negative for abdominal pain, constipation and heartburn.  Musculoskeletal: Negative for back pain, joint pain and myalgias.  Skin: Negative.   Neurological: Negative for dizziness.  Psychiatric/Behavioral: The patient is not nervous/anxious.     Physical Exam Constitutional:      General: She is not in acute distress.    Appearance: She is well-developed. She is not diaphoretic.  Neck:     Thyroid: No thyromegaly.  Cardiovascular:     Rate and Rhythm: Normal rate and regular rhythm.     Pulses: Normal pulses.     Heart sounds: Murmur present.     Comments: 3/6 Pulmonary:     Effort: Pulmonary effort is normal. No respiratory distress.     Breath sounds: Normal breath sounds.  Abdominal:     General: Bowel sounds are normal. There is no distension.     Palpations: Abdomen is soft.     Tenderness: There is no abdominal tenderness.  Musculoskeletal:        General: Normal range of motion.     Cervical back: Neck supple.     Right lower leg: No edema.     Left lower leg: No edema.  Lymphadenopathy:     Cervical: No cervical adenopathy.  Skin:    General: Skin is warm and dry.  Neurological:     Mental Status: She is alert. Mental status is at baseline.  Psychiatric:        Mood and Affect: Mood normal.       ASSESSMENT/ PLAN:  TODAY  1. Alzheimer's disease late onset without behavioral disturbance 2. Essential hypertension 3. major  depression  Will continue current medications Will continue current plan of care Will continue to monitor her status.     MD is aware of resident's narcotic use and is in agreement with current plan of care. We will attempt to wean resident as appropriate.  Synthia Innocent NP [facility]  Contact [phone] Monday  through Friday 8am- 5pm  After hours call [phone]

## 2019-09-27 DIAGNOSIS — I639 Cerebral infarction, unspecified: Secondary | ICD-10-CM | POA: Diagnosis not present

## 2019-09-27 DIAGNOSIS — Z1159 Encounter for screening for other viral diseases: Secondary | ICD-10-CM | POA: Diagnosis not present

## 2019-10-04 DIAGNOSIS — I639 Cerebral infarction, unspecified: Secondary | ICD-10-CM | POA: Diagnosis not present

## 2019-10-04 DIAGNOSIS — Z1159 Encounter for screening for other viral diseases: Secondary | ICD-10-CM | POA: Diagnosis not present

## 2019-10-11 DIAGNOSIS — I639 Cerebral infarction, unspecified: Secondary | ICD-10-CM | POA: Diagnosis not present

## 2019-10-11 DIAGNOSIS — Z1159 Encounter for screening for other viral diseases: Secondary | ICD-10-CM | POA: Diagnosis not present

## 2019-10-15 ENCOUNTER — Encounter: Payer: Self-pay | Admitting: Adult Health

## 2019-10-15 ENCOUNTER — Non-Acute Institutional Stay (SKILLED_NURSING_FACILITY): Payer: Medicare PPO | Admitting: Adult Health

## 2019-10-15 DIAGNOSIS — I63413 Cerebral infarction due to embolism of bilateral middle cerebral arteries: Secondary | ICD-10-CM | POA: Diagnosis not present

## 2019-10-15 DIAGNOSIS — G301 Alzheimer's disease with late onset: Secondary | ICD-10-CM

## 2019-10-15 DIAGNOSIS — F321 Major depressive disorder, single episode, moderate: Secondary | ICD-10-CM

## 2019-10-15 DIAGNOSIS — F0280 Dementia in other diseases classified elsewhere without behavioral disturbance: Secondary | ICD-10-CM | POA: Diagnosis not present

## 2019-10-15 NOTE — Progress Notes (Signed)
Location:    [facility] Room Number: 142D Place of Service:  SNF (31) Carleene Overlie NP    CODE STATUS: DNR  No Known Allergies  Chief Complaint  Patient presents with  . 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 There are no reports of uncontrolled pain. She is slowly losing weight which is an unfortunate outcome in the last stages of this disease process. There are no reports of anxiety or agitation.    Past Medical History:  Diagnosis Date  . Dementia without behavioral disturbance ([facility]) 04/16/2016   mild with MMSE 24/30 as of 04/16/16  . Diverticulitis   . Hyperlipemia   . Hypertension   . Macular degeneration   . OA (osteoarthritis)     Past Surgical History:  Procedure Laterality Date  . APPENDECTOMY    . TONSILLECTOMY AND ADENOIDECTOMY      Social History   Socioeconomic History  . Marital status: Widowed    Spouse name: Not on file  . Number of children: Not on file  . Years of education: 13 yrs   . Highest education level: Not on file  Occupational History  . Occupation: retired  Tobacco Use  . Smoking status: Never Smoker  . Smokeless tobacco: Never Used  Substance and Sexual Activity  . Alcohol use: No  . Drug use: No  . Sexual activity: Not Currently  Other Topics Concern  . Not on file  Social History Narrative   Is a long term resident of [facility]    Social Determinants of Health   Financial Resource Strain: Low Risk   . Difficulty of Paying Living Expenses: Not hard at all  Food Insecurity: No Food Insecurity  . Worried About Programme researcher, broadcasting/film/video in the Last Year: Never true  . Ran Out of Food in the Last Year: Never  true  Transportation Needs: No Transportation Needs  . Lack of Transportation (Medical): No  . Lack of Transportation (Non-Medical): No  Physical Activity: Inactive  . Days of Exercise per Week: 0 days  . Minutes of Exercise per Session: 0 min  Stress: No Stress Concern Present  . Feeling of Stress : Not at all  Social Connections: Severely Isolated  . Frequency of Communication with Friends and Family: Once a week  . Frequency of Social Gatherings with Friends and Family: Never  . Attends Religious Services: Never  . Active Member of Clubs or Organizations: No  . Attends Banker Meetings: Never  . Marital Status: Widowed  Intimate Partner Violence: Not At Risk  . Fear of Current or Ex-Partner: No  . Emotionally Abused: No  . Physically Abused: No  . Sexually Abused: No   Family History  Problem Relation Age of Onset  . Cancer Other        family history   . Arthritis Other        family history   . Heart attack Mother 40  . Stroke Father 64  . Macular degeneration Sister   . 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 aspirin EC 325 MG EC tablet Take 1 tablet (325 mg total) by mouth daily.  Roseanne Kaufman Peru-Castor Oil (VENELEX) OINT Apply to sacrum and bilateral buttocks qshift & prn for prevention.  . magnesium hydroxide (MILK OF MAGNESIA) 400 MG/5ML suspension Take 30 mLs by mouth daily as needed for mild constipation.  . NON FORMULARY Diet type:  Regular  . Nutritional Supplements (ENSURE ENLIVE PO) Give 120 ml by mouth two times daily between meals   No facility-administered encounter medications on file as of 10/15/2019.     SIGNIFICANT DIAGNOSTIC EXAMS   LABS REVIEWED PREVIOUS:   09-17-18:  hgb a1c 4.9; chol 220; ldl 153; trig 96; hdl 48 10-02-18: wbc 5.6; hgb 12.3; hct 39.6; mcv 99.5; plt 236; glucose 99; bun 17; creat 1.17; k+ 3.6; na++ 141; ca 8.6; liver normal albumin 3.2 10-09-18: glucose 93; bun 18; creat 1.02 ;k+ 3.9; na++140 ca 8.5  10-19-18: mag 2.1 01-18-19: glucose 164 bun 27; creat 1.40 k+ 3.7; na++ 141 ca 9.1 06-08-19: wbc 6.2; hgb 12.0; hct 39.3 mcv 100.3 plt 220; glucose 89; bun 27; creat 1.20; k+ 4.1; na++ 141; ca 8.8; liver normal albumin 3.4   NO NEW LABS.   Review of Systems  Constitutional: Negative for malaise/fatigue.  Respiratory: Negative for cough and shortness of breath.   Cardiovascular: Negative for chest pain, palpitations and leg swelling.  Gastrointestinal: Negative for abdominal pain, constipation and heartburn.  Musculoskeletal: Negative for back pain, joint pain and myalgias.  Skin: Negative.   Neurological: Negative for dizziness.  Psychiatric/Behavioral: The patient is not nervous/anxious.       Physical Exam Constitutional:      General: She is not in acute distress.    Appearance: She is well-developed. She is not diaphoretic.  Neck:     Thyroid: No thyromegaly.  Cardiovascular:     Rate and Rhythm: Normal rate and regular rhythm.     Pulses: Normal pulses.     Heart sounds: Murmur present.     Comments: 3/6 Pulmonary:     Effort: Pulmonary effort is normal. No respiratory distress.     Breath sounds: Normal breath sounds.  Abdominal:     General: Bowel sounds are normal. There is no distension.     Palpations: Abdomen is soft.     Tenderness: There is no abdominal tenderness.  Musculoskeletal:        General: Normal range of motion.     Cervical back: Neck supple.     Right lower leg: No edema.     Left lower leg: No edema.  Lymphadenopathy:     Cervical: No cervical adenopathy.  Skin:    General: Skin is warm and dry.  Neurological:     Mental Status: She is alert. Mental status is at baseline.  Psychiatric:        Mood  and Affect: Mood normal.     ASSESSMENT/ PLAN:  TODAY  1. 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 Depression major single episode moderate: is stable is off zoloft will monitor  3. Late onset alzheimer's disease without significant  change in status weight is 138 pounds weight loss is an unfortunate outcome in the late stages of this disease process   PREVIOUS    4. Mixed hyperlipidemia: is stable LDL 153 will not make changes will monitor   5. Hypokalemia: is stable k+ 4.1 will monitor   6. Weight loss non-intentional: weight 02-06-19: 147 pounds  current weight is 138 is stable will continue supplements as directed and will monitor   7. Essential hypertension; is stable b/p 116/54 will continue to monitor       MD is aware of resident's narcotic use and is in agreement with current plan of care. We will attempt to wean resident as appropriate.  Synthia Innocent NP [facility]  Contact [phone] Monday through Friday 8am- 5pm  After hours call [phone]

## 2019-10-18 DIAGNOSIS — Z1159 Encounter for screening for other viral diseases: Secondary | ICD-10-CM | POA: Diagnosis not present

## 2019-10-18 DIAGNOSIS — I639 Cerebral infarction, unspecified: Secondary | ICD-10-CM | POA: Diagnosis not present

## 2019-10-26 DIAGNOSIS — I639 Cerebral infarction, unspecified: Secondary | ICD-10-CM | POA: Diagnosis not present

## 2019-10-26 DIAGNOSIS — Z1159 Encounter for screening for other viral diseases: Secondary | ICD-10-CM | POA: Diagnosis not present

## 2019-11-02 DIAGNOSIS — I639 Cerebral infarction, unspecified: Secondary | ICD-10-CM | POA: Diagnosis not present

## 2019-11-02 DIAGNOSIS — Z1159 Encounter for screening for other viral diseases: Secondary | ICD-10-CM | POA: Diagnosis not present

## 2019-11-14 ENCOUNTER — Encounter: Payer: Self-pay | Admitting: Internal Medicine

## 2019-11-14 ENCOUNTER — Non-Acute Institutional Stay (SKILLED_NURSING_FACILITY): Payer: Medicare PPO | Admitting: Internal Medicine

## 2019-11-14 DIAGNOSIS — H3530 Unspecified macular degeneration: Secondary | ICD-10-CM | POA: Diagnosis not present

## 2019-11-14 DIAGNOSIS — I639 Cerebral infarction, unspecified: Secondary | ICD-10-CM | POA: Diagnosis not present

## 2019-11-14 DIAGNOSIS — Z1159 Encounter for screening for other viral diseases: Secondary | ICD-10-CM | POA: Diagnosis not present

## 2019-11-14 DIAGNOSIS — G301 Alzheimer's disease with late onset: Secondary | ICD-10-CM | POA: Diagnosis not present

## 2019-11-14 DIAGNOSIS — E785 Hyperlipidemia, unspecified: Secondary | ICD-10-CM

## 2019-11-14 DIAGNOSIS — F0280 Dementia in other diseases classified elsewhere without behavioral disturbance: Secondary | ICD-10-CM

## 2019-11-14 NOTE — Progress Notes (Signed)
Location:  [place] Room Number: 142-D Place of Service:  SNF (31)  Hennie Duos, MD  Patient Care Team: Hennie Duos, MD as PCP - General (Internal Medicine) Nyoka Cowden Phylis Bougie, NP as Nurse Practitioner ([place]) [facility], [place] ([place])  Extended Emergency Contact Information Primary Emergency Contact: Waterson,Carol Address: [place], [place] [address] [address] [place] Phone: [phone] Relation: Relative    Allergies: Patient has no known allergies.  Chief Complaint  Patient presents with  . Medical Management of Chronic Issues    Routine [place] visit    HPI: Patient is a 86 y.o. female who is being seen for routine issues of macular degeneration, dementia, and hyperlipidemia.  Past Medical History:  Diagnosis Date  . Dementia without behavioral disturbance ([place]) 04/16/2016   mild with MMSE 24/30 as of 04/16/16  . Diverticulitis   . Hyperlipemia   . Hypertension   . Macular degeneration   . OA (osteoarthritis)     Past Surgical History:  Procedure Laterality Date  . APPENDECTOMY    . TONSILLECTOMY AND ADENOIDECTOMY      Allergies as of 11/14/2019   No Known Allergies     Medication List    Notice   This visit is during an admission. Changes to the med list made in this visit will be reflected in the After Visit Summary of the admission.     No orders of the defined types were placed in this encounter.   Immunization History  Administered Date(s) Administered  . Influenza-Unspecified 06/18/2019  . Pneumococcal Conjugate-13 06/05/2019  . Pneumococcal Polysaccharide-23 02/11/2017  . Tdap 06/05/2019    Social History   Tobacco Use  . Smoking status: Never Smoker  . Smokeless tobacco: Never Used  Substance Use Topics  . Alcohol use: No    Review of Systems   GENERAL:  no fevers, fatigue, appetite changes SKIN: No itching,  rash HEENT: No complaint RESPIRATORY: No cough, wheezing, SOB CARDIAC: No chest pain, palpitations, lower extremity edema  GI: No abdominal pain, No N/V/D or constipation, No heartburn or reflux  GU: No dysuria, frequency or urgency, or incontinence  MUSCULOSKELETAL: No unrelieved bone/joint pain NEUROLOGIC: No headache, dizziness  PSYCHIATRIC: No overt anxiety or sadness  Vitals:   11/14/19 1544  BP: (!) 116/54  Pulse: 69  Resp: 16  Temp: 98.4 F (36.9 C)  SpO2: 99%   Body mass index is 22.53 kg/m. Physical Exam  GENERAL APPEARANCE: Alert, conversant, adorable no acute distress  SKIN: No diaphoresis rash HEENT: Unremarkable RESPIRATORY: Breathing is even, unlabored. Lung sounds are clear   CARDIOVASCULAR: Heart RRR no murmurs, rubs or gallops. No peripheral edema  GASTROINTESTINAL: Abdomen is soft, non-tender, not distended w/ normal bowel sounds.  GENITOURINARY: Bladder non tender, not distended  MUSCULOSKELETAL: No abnormal joints or musculature NEUROLOGIC: Cranial nerves 2-12 grossly intact. Moves all extremities PSYCHIATRIC: Mood and affect appropriate with dementia, no behavioral issues  Patient Active Problem List   Diagnosis Date Noted  . Depression, major, single episode, moderate ([place]) 03/29/2019  . Hypokalemia 11/03/2018  . Weight loss, non-intentional 11/03/2018  . CVA (cerebral vascular accident) ([place]) 09/17/2018  . HLD (hyperlipidemia) 09/17/2018  . HTN (hypertension) 09/17/2018  . Dementia without behavioral disturbance ([place]) 04/16/2016  . Macular degeneration 04/16/2016    CMP     Component Value Date/Time   NA 141 06/08/2019 0600   K  4.1 06/08/2019 0600   CL 110 06/08/2019 0600   CO2 24 06/08/2019 0600   GLUCOSE 89 06/08/2019 0600   BUN 27 (H) 06/08/2019 0600   CREATININE 1.20 (H) 06/08/2019 0600   CREATININE 1.28 (H) 02/11/2017 1655   CALCIUM 8.8 (L) 06/08/2019 0600   PROT 6.3 (L) 06/08/2019 0600   ALBUMIN 3.4 (L) 06/08/2019 0600   AST 19  06/08/2019 0600   ALT 11 06/08/2019 0600   ALKPHOS 52 06/08/2019 0600   BILITOT 0.4 06/08/2019 0600   GFRNONAA 38 (L) 06/08/2019 0600   GFRNONAA 36 (L) 02/11/2017 1655   GFRAA 44 (L) 06/08/2019 0600   GFRAA 42 (L) 02/11/2017 1655   Recent Labs    01/18/19 0751 06/08/19 0600  NA 141 141  K 3.7 4.1  CL 108 110  CO2 23 24  GLUCOSE 164* 89  BUN 27* 27*  CREATININE 1.40* 1.20*  CALCIUM 9.1 8.8*   Recent Labs    06/08/19 0600  AST 19  ALT 11  ALKPHOS 52  BILITOT 0.4  PROT 6.3*  ALBUMIN 3.4*   Recent Labs    06/08/19 0600  WBC 6.2  HGB 12.0  HCT 39.3  MCV 100.3*  PLT 220   No results for input(s): CHOL, LDLCALC, TRIG in the last 8760 hours.  Invalid input(s): HCL No results found for: [facility] Lab Results  Component Value Date   TSH 4.03 02/11/2017   Lab Results  Component Value Date   HGBA1C 4.9 09/17/2018   Lab Results  Component Value Date   CHOL 220 (H) 09/17/2018   HDL 48 09/17/2018   LDLCALC 153 (H) 09/17/2018   TRIG 96 09/17/2018   CHOLHDL 4.6 09/17/2018    Significant Diagnostic Results in last 30 days:  No results found.  Assessment and Plan  Macular degeneration Patient is on no medications for same; patient no complaints; continue supportive care  HLD (hyperlipidemia) Patient is no longer on statin secondary to her age; continue supportive care  Dementia without behavioral disturbance On no dementia specific medication; continue supportive care    Margit Hanks, MD

## 2019-11-18 ENCOUNTER — Encounter: Payer: Self-pay | Admitting: Internal Medicine

## 2019-11-18 NOTE — Assessment & Plan Note (Signed)
Patient is on no medications for same; patient no complaints; continue supportive care

## 2019-11-18 NOTE — Assessment & Plan Note (Signed)
Patient is no longer on statin secondary to her age; continue supportive care

## 2019-11-18 NOTE — Assessment & Plan Note (Signed)
On no dementia specific medication; continue supportive care

## 2019-11-22 DIAGNOSIS — Z1159 Encounter for screening for other viral diseases: Secondary | ICD-10-CM | POA: Diagnosis not present

## 2019-11-22 DIAGNOSIS — I639 Cerebral infarction, unspecified: Secondary | ICD-10-CM | POA: Diagnosis not present

## 2019-11-28 DIAGNOSIS — Z1159 Encounter for screening for other viral diseases: Secondary | ICD-10-CM | POA: Diagnosis not present

## 2019-11-28 DIAGNOSIS — I639 Cerebral infarction, unspecified: Secondary | ICD-10-CM | POA: Diagnosis not present

## 2019-12-18 DIAGNOSIS — Z1159 Encounter for screening for other viral diseases: Secondary | ICD-10-CM | POA: Diagnosis not present

## 2019-12-18 DIAGNOSIS — I639 Cerebral infarction, unspecified: Secondary | ICD-10-CM | POA: Diagnosis not present

## 2019-12-21 ENCOUNTER — Non-Acute Institutional Stay (SKILLED_NURSING_FACILITY): Payer: Medicare PPO | Admitting: Adult Health

## 2019-12-21 ENCOUNTER — Encounter: Payer: Self-pay | Admitting: Adult Health

## 2019-12-21 DIAGNOSIS — E785 Hyperlipidemia, unspecified: Secondary | ICD-10-CM | POA: Diagnosis not present

## 2019-12-21 DIAGNOSIS — F321 Major depressive disorder, single episode, moderate: Secondary | ICD-10-CM | POA: Diagnosis not present

## 2019-12-21 DIAGNOSIS — G301 Alzheimer's disease with late onset: Secondary | ICD-10-CM

## 2019-12-21 DIAGNOSIS — I10 Essential (primary) hypertension: Secondary | ICD-10-CM | POA: Diagnosis not present

## 2019-12-21 DIAGNOSIS — I63413 Cerebral infarction due to embolism of bilateral middle cerebral arteries: Secondary | ICD-10-CM | POA: Diagnosis not present

## 2019-12-21 DIAGNOSIS — R634 Abnormal weight loss: Secondary | ICD-10-CM | POA: Diagnosis not present

## 2019-12-21 DIAGNOSIS — F0280 Dementia in other diseases classified elsewhere without behavioral disturbance: Secondary | ICD-10-CM | POA: Diagnosis not present

## 2019-12-21 NOTE — Progress Notes (Signed)
Provider:Debbie Rolly Salter NP  Location   [place]  PCP: Hennie Duos, MD   Extended Emergency Contact Information Primary Emergency Contact: Radliff,Carol Address: [address]          [place], [place] [address] Johnnette Litter of [place] Phone: [phone] Relation: Relative  Codes status: dnr Goals of care: advanced directive information Advanced Directives 12/21/2019  Does Patient Have a Medical Advance Directive? Yes  Type of Advance Directive Out of facility DNR (pink MOST or yellow form)  Does patient want to make changes to medical advance directive? No - Patient declined  Copy of [place] in Chart? -  Pre-existing out of facility DNR order (yellow form or pink MOST form) Yellow form placed in chart (order not valid for inpatient use)     No Known Allergies  Chief Complaint  Patient presents with  . Annual Exam    Annual     HPI  She is a 86 year old long term resident of this facility being seen for her annual exam. She has not been hospitalized over the past year. She hs been slowly losing weight over this past year to 134 pounds. Her pain continues to be managed; no reports of agitation. She is somewhat more confused over this past year. She continues to be followed for her chronic illnesses including: cav dementia depression.     Past Medical History:  Diagnosis Date  . Dementia without behavioral disturbance ([place]) 04/16/2016   mild with MMSE 24/30 as of 04/16/16  . Diverticulitis   . Hyperlipemia   . Hypertension   . Macular degeneration   . OA (osteoarthritis)    Past Surgical History:  Procedure Laterality Date  . APPENDECTOMY    . TONSILLECTOMY AND ADENOIDECTOMY      reports that she has never smoked. She has never used smokeless tobacco. She reports that she does not drink alcohol or use drugs. Social History   Tobacco Use  . Smoking status: Never Smoker  . Smokeless tobacco: Never Used  Substance Use Topics   . Alcohol use: No  . Drug use: No   Family History  Problem Relation Age of Onset  . Cancer Other        family history   . Arthritis Other        family history   . Heart attack Mother 29  . Stroke Father 21  . Macular degeneration Sister   . 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 INFLUENZA VACCINE  04/13/2020  . PNA vac Low Risk Adult  Completed  . DEXA SCAN  Discontinued   Fall Risk  08/01/2019 02/11/2017 07/02/2016 04/16/2016  Falls in the past year? 1 No No No  Number falls in past yr: 1 - - -  Injury with Fall? 0 - - -  Risk for fall due to : History of fall(s);Impaired mobility - - -  Follow up Falls evaluation completed - - -   Depression screen [facility] 2/9 08/01/2019 07/02/2016 04/16/2016  Decreased Interest 0 0 0  Down, Depressed, Hopeless 0 0 0  PHQ - 2 Score 0 0 0        Outpatient Encounter Medications as of 12/21/2019  Medication Sig  . aspirin EC 325 MG EC tablet Take 1 tablet (325 mg total) by mouth daily.  Roseanne Kaufman  [place]-Castor Oil (VENELEX) OINT Apply to sacrum and bilateral buttocks qshift & prn for prevention.  . magnesium hydroxide (MILK OF MAGNESIA) 400 MG/5ML suspension Take 30 mLs by mouth daily as needed for mild constipation.  . NON FORMULARY Diet type:  Regular  . Nutritional Supplements (ENSURE ENLIVE PO) Give 120 ml by mouth two times daily between meals   No facility-administered encounter medications on file as of 12/21/2019.     Vitals:   12/21/19 0912  BP: 112/65  Pulse: 68  Resp: 16  Temp: 97.6 F (36.4 C)  TempSrc: Oral  SpO2: 99%  Weight: 134 lb 3.2 oz (60.9 kg)  Height: 5\' 5"  (1.651 m)   Body mass index is 22.33 kg/m.  DIAGNOSTIC EXAMS   LABS REVIEWED PREVIOUS:   01-18-19: glucose 164 bun 27; creat 1.40 k+ 3.7; na++ 141 ca 9.1 06-08-19: wbc 6.2; hgb 12.0; hct 39.3 mcv 100.3 plt 220; glucose 89; bun 27; creat 1.20; k+ 4.1; na++ 141; ca 8.8;  liver normal albumin 3.4   NO NEW LABS.   Review of Systems  Unable to perform ROS: Dementia (unable to participate )     Physical Exam Constitutional:      General: She is not in acute distress.    Appearance: She is well-developed. She is not diaphoretic.  HENT:     Right Ear: Tympanic membrane normal.     Left Ear: Tympanic membrane normal.     Nose: Nose normal.     Mouth/Throat:     Mouth: Mucous membranes are moist.     Pharynx: Oropharynx is clear.  Eyes:     Conjunctiva/sclera: Conjunctivae normal.  Neck:     Thyroid: No thyromegaly.  Cardiovascular:     Rate and Rhythm: Normal rate and regular rhythm.     Pulses: Normal pulses.     Heart sounds: Murmur present.     Comments: 3/6 Pulmonary:     Effort: Pulmonary effort is normal. No respiratory distress.     Breath sounds: Normal breath sounds.  Abdominal:     General: Bowel sounds are normal. There is no distension.     Palpations: Abdomen is soft.     Tenderness: There is no abdominal tenderness.  Musculoskeletal:        General: Normal range of motion.     Cervical back: Neck supple.     Right lower leg: No edema.     Left lower leg: No edema.  Lymphadenopathy:     Cervical: No cervical adenopathy.  Skin:    General: Skin is warm and dry.  Neurological:     Mental Status: She is alert. Mental status is at baseline.  Psychiatric:        Mood and Affect: Mood normal.        ASSESSMENT/ PLAN:  TODAY  1. 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 Depression major single episode moderate: is stable is off zoloft will monitor  3. Late onset alzheimer's disease without significant change in status weight is 134 pounds weight loss is an unfortunate outcome in the late stages of this disease process   4. Mixed hyperlipidemia: is stable LDL 153 will not make changes will monitor   5. Hypokalemia: is stable k+ 4.1  will monitor   6. Weight loss non-intentional: weight 02-06-19: 147 pounds  current weight is 134 is stable will continue supplements as directed and will monitor  7. Essential hypertension; is stable b/p 112/65 will continue to monitor

## 2019-12-24 ENCOUNTER — Encounter (HOSPITAL_COMMUNITY)
Admission: RE | Admit: 2019-12-24 | Discharge: 2019-12-24 | Disposition: A | Discharge status: Home / Self Care | Payer: Medicare PPO | Source: Ambulatory Visit | Attending: Adult Health | Admitting: Adult Health

## 2019-12-24 DIAGNOSIS — I639 Cerebral infarction, unspecified: Secondary | ICD-10-CM | POA: Diagnosis not present

## 2019-12-24 DIAGNOSIS — I10 Essential (primary) hypertension: Secondary | ICD-10-CM | POA: Diagnosis not present

## 2019-12-24 LAB — COMPREHENSIVE METABOLIC PANEL
ALT: 11 U/L (ref 0–44)
AST: 18 U/L (ref 15–41)
Albumin: 3.6 g/dL (ref 3.5–5.0)
Alkaline Phosphatase: 49 U/L (ref 38–126)
Anion gap: 11 (ref 5–15)
BUN: 28 mg/dL — ABNORMAL HIGH (ref 8–23)
CO2: 22 mmol/L (ref 22–32)
Calcium: 9.1 mg/dL (ref 8.9–10.3)
Chloride: 105 mmol/L (ref 98–111)
Creatinine, Ser: 1.4 mg/dL — ABNORMAL HIGH (ref 0.44–1.00)
GFR calc Af Amer: 37 mL/min — ABNORMAL LOW (ref >60)
GFR calc non Af Amer: 32 mL/min — ABNORMAL LOW (ref >60)
Glucose, Bld: 106 mg/dL — ABNORMAL HIGH (ref 70–99)
Potassium: 4 mmol/L (ref 3.5–5.1)
Sodium: 138 mmol/L (ref 135–145)
Total Bilirubin: 0.4 mg/dL (ref 0.3–1.2)
Total Protein: 6.6 g/dL (ref 6.5–8.1)

## 2019-12-24 LAB — CBC
HCT: 41.7 % (ref 36.0–46.0)
Hemoglobin: 13 g/dL (ref 12.0–15.0)
MCH: 31 pg (ref 26.0–34.0)
MCHC: 31.2 g/dL (ref 30.0–36.0)
MCV: 99.3 fL (ref 80.0–100.0)
Platelets: 221 10*3/uL (ref 150–400)
RBC: 4.2 MIL/uL (ref 3.87–5.11)
RDW: 13.8 % (ref 11.5–15.5)
WBC: 7.9 10*3/uL (ref 4.0–10.5)
nRBC: 0 % (ref 0.0–0.2)

## 2019-12-27 ENCOUNTER — Non-Acute Institutional Stay (SKILLED_NURSING_FACILITY): Payer: Medicare PPO | Admitting: Adult Health

## 2019-12-27 ENCOUNTER — Encounter: Payer: Self-pay | Admitting: Adult Health

## 2019-12-27 DIAGNOSIS — I63413 Cerebral infarction due to embolism of bilateral middle cerebral arteries: Secondary | ICD-10-CM

## 2019-12-27 DIAGNOSIS — F0280 Dementia in other diseases classified elsewhere without behavioral disturbance: Secondary | ICD-10-CM | POA: Diagnosis not present

## 2019-12-27 DIAGNOSIS — G301 Alzheimer's disease with late onset: Secondary | ICD-10-CM | POA: Diagnosis not present

## 2019-12-27 DIAGNOSIS — I10 Essential (primary) hypertension: Secondary | ICD-10-CM | POA: Diagnosis not present

## 2019-12-27 NOTE — Progress Notes (Signed)
Location:    [place] Room Number: 142/D Place of Service:  SNF (31)   CODE STATUS: DNR  No Known Allergies  Chief Complaint  Patient presents with  . Acute Visit    care plan meeting      HPI:  We have come together for her care plan meeting. BIMS 6/15 mood 0/30.  She has lost 4% of her body weight over the past 6 months. She has a good appetite; is taking her supplements. There are no reports of falls. She denies any pain. She continues to be followed for her chronic illnesses including: dementia; hypertension; cva.   Past Medical History:  Diagnosis Date  . Dementia without behavioral disturbance ([place]) 04/16/2016   mild with MMSE 24/30 as of 04/16/16  . Diverticulitis   . Hyperlipemia   . Hypertension   . Macular degeneration   . OA (osteoarthritis)     Past Surgical History:  Procedure Laterality Date  . APPENDECTOMY    . TONSILLECTOMY AND ADENOIDECTOMY      Social History   Socioeconomic History  . Marital status: Widowed    Spouse name: Not on file  . Number of children: Not on file  . Years of education: 13 yrs   . Highest education level: Not on file  Occupational History  . Occupation: retired  Tobacco Use  . Smoking status: Never Smoker  . Smokeless tobacco: Never Used  Substance and Sexual Activity  . Alcohol use: No  . Drug use: No  . Sexual activity: Not Currently  Other Topics Concern  . Not on file  Social History Narrative   Is a long term resident of [facility]    Social Determinants of Health   Financial Resource Strain: Low Risk   . Difficulty of Paying Living Expenses: Not hard at all  Food Insecurity: No Food Insecurity  . Worried About Charity fundraiser in the Last Year: Never true  . Ran Out of Food in the Last Year: Never true  Transportation Needs: No Transportation Needs  . Lack of Transportation (Medical): No  . Lack of Transportation (Non-Medical): No  Physical Activity: Inactive  . Days of Exercise per  Week: 0 days  . Minutes of Exercise per Session: 0 min  Stress: No Stress Concern Present  . Feeling of Stress : Not at all  Social Connections: Severely Isolated  . Frequency of Communication with Friends and Family: Once a week  . Frequency of Social Gatherings with Friends and Family: Never  . Attends Religious Services: Never  . Active Member of Clubs or Organizations: No  . Attends Archivist Meetings: Never  . Marital Status: Widowed  Intimate Partner Violence: Not At Risk  . Fear of Current or Ex-Partner: No  . Emotionally Abused: No  . Physically Abused: No  . Sexually Abused: No   Family History  Problem Relation Age of Onset  . Cancer Other        family history   . Arthritis Other        family history   . Heart attack Mother 20  . Stroke Father 88  . Macular degeneration Sister   . 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 aspirin EC 325 MG EC tablet Take 1 tablet (325 mg total) by mouth daily.  Lucilla Lame [place]-Castor Oil (VENELEX) OINT Apply to sacrum and bilateral buttocks qshift & prn for prevention.  . magnesium hydroxide (MILK OF MAGNESIA) 400 MG/5ML suspension Take 30 mLs by mouth daily as needed for mild constipation.  . NON FORMULARY Diet type:  Regular  . Nutritional Supplements (ENSURE ENLIVE PO) Give 120 ml by mouth two times daily between meals   No facility-administered encounter medications on file as of 12/27/2019.     SIGNIFICANT DIAGNOSTIC EXAMS   LABS REVIEWED PREVIOUS:   01-18-19: glucose 164 bun 27; creat 1.40 k+ 3.7; na++ 141 ca 9.1 06-08-19: wbc 6.2; hgb 12.0; hct 39.3 mcv 100.3 plt 220; glucose 89; bun 27; creat 1.20; k+ 4.1; na++ 141; ca 8.8; liver normal albumin 3.4    TODAY  12-24-19: wbc 7.9; hgb 13.0 hct 41.7 mcv 99.3 plt 221; glucose 106; bun 28; creat 1.40 ;k+ 4.0; na++ 138; ca 9.1 liver normal albumin 3.6   Review of Systems  Unable to perform ROS: Dementia (unable to participate )    Physical Exam Constitutional:      General: She is not in acute distress.    Appearance: She is well-developed. She is not diaphoretic.  Neck:     Thyroid: No thyromegaly.  Cardiovascular:     Rate and Rhythm: Normal rate and regular rhythm.     Pulses: Normal pulses.     Heart sounds: Murmur present.     Comments: 3/6 Pulmonary:     Effort: Pulmonary effort is normal. No respiratory distress.     Breath sounds: Normal breath sounds.  Abdominal:     General: Bowel sounds are normal. There is no distension.     Palpations: Abdomen is soft.     Tenderness: There is no abdominal tenderness.  Musculoskeletal:        General: Normal range of motion.     Cervical back: Neck supple.     Right lower leg: No edema.     Left lower leg: No edema.  Lymphadenopathy:     Cervical: No cervical adenopathy.  Skin:    General: Skin is warm and dry.  Neurological:     Mental Status: She is alert. Mental status is at baseline.  Psychiatric:        Mood and Affect: Mood normal.        ASSESSMENT/ PLAN:  TODAY  1. Late onset alzheimer's disease 2. Cerebrovascular disease (CVA) due to bilateral embolism of middle cerebral arteries  3. Essential hypertension  Is stable Will continue current medications Will continue current plan of care Will continue to monitor her status.     MD is aware of resident's narcotic use and is in agreement with current plan of care. We will attempt to wean resident as appropriate.  Synthia Innocent NP [facility]  Contact [phone] Monday through Friday 8am- 5pm  After hours call [phone]

## 2020-01-09 DIAGNOSIS — I639 Cerebral infarction, unspecified: Secondary | ICD-10-CM | POA: Diagnosis not present

## 2020-01-09 DIAGNOSIS — Z1159 Encounter for screening for other viral diseases: Secondary | ICD-10-CM | POA: Diagnosis not present

## 2020-01-10 DIAGNOSIS — I739 Peripheral vascular disease, unspecified: Secondary | ICD-10-CM | POA: Diagnosis not present

## 2020-01-10 DIAGNOSIS — M2042 Other hammer toe(s) (acquired), left foot: Secondary | ICD-10-CM | POA: Diagnosis not present

## 2020-01-10 DIAGNOSIS — M2041 Other hammer toe(s) (acquired), right foot: Secondary | ICD-10-CM | POA: Diagnosis not present

## 2020-01-10 DIAGNOSIS — B351 Tinea unguium: Secondary | ICD-10-CM | POA: Diagnosis not present

## 2020-01-25 ENCOUNTER — Non-Acute Institutional Stay (SKILLED_NURSING_FACILITY): Payer: Medicare PPO | Admitting: Adult Health

## 2020-01-25 ENCOUNTER — Encounter: Payer: Self-pay | Admitting: Adult Health

## 2020-01-25 DIAGNOSIS — I63413 Cerebral infarction due to embolism of bilateral middle cerebral arteries: Secondary | ICD-10-CM

## 2020-01-25 DIAGNOSIS — F0280 Dementia in other diseases classified elsewhere without behavioral disturbance: Secondary | ICD-10-CM | POA: Diagnosis not present

## 2020-01-25 DIAGNOSIS — G301 Alzheimer's disease with late onset: Secondary | ICD-10-CM

## 2020-01-25 DIAGNOSIS — F321 Major depressive disorder, single episode, moderate: Secondary | ICD-10-CM | POA: Diagnosis not present

## 2020-01-25 NOTE — Progress Notes (Signed)
Location:    [facility] Room Number: 142/D Place of Service:  SNF (31)   CODE STATUS: DNR  No Known Allergies  Chief Complaint  Patient presents with  . Medical Management of Chronic Issues          Cerebrovascular accident (CVA) due to bilateral embolism of middle cerebral arteries:  Depression major single episode moderate:   Late onset alzheimer's disease     HPI:  Lynn Mcintosh is a 86 year old long term resident of this facility being seen for the management of his chronic illnesses: cva; depression; dementia. There are no reports of agitation; anxiety; no reports of uncontrolled pain. Her weight is presently 131 pounds.   Past Medical History:  Diagnosis Date  . Dementia without behavioral disturbance ([facility]) 04/16/2016   mild with MMSE 24/30 as of 04/16/16  . Diverticulitis   . Hyperlipemia   . Hypertension   . Macular degeneration   . OA (osteoarthritis)     Past Surgical History:  Procedure Laterality Date  . APPENDECTOMY    . TONSILLECTOMY AND ADENOIDECTOMY      Social History   Socioeconomic History  . Marital status: Widowed    Spouse name: Not on file  . Number of children: Not on file  . Years of education: 13 yrs   . Highest education level: Not on file  Occupational History  . Occupation: retired  Tobacco Use  . Smoking status: Never Smoker  . Smokeless tobacco: Never Used  Substance and Sexual Activity  . Alcohol use: No  . Drug use: No  . Sexual activity: Not Currently  Other Topics Concern  . Not on file  Social History Narrative   Is a long term resident of [facility]    Social Determinants of Health   Financial Resource Strain: Low Risk   . Difficulty of Paying Living Expenses: Not hard at all  Food Insecurity: No Food Insecurity  . Worried About Programme researcher, broadcasting/film/video in the Last Year: Never true  . Ran Out of Food in the Last Year: Never true  Transportation Needs: No Transportation Needs  . Lack of Transportation (Medical): No   . Lack of Transportation (Non-Medical): No  Physical Activity: Inactive  . Days of Exercise per Week: 0 days  . Minutes of Exercise per Session: 0 min  Stress: No Stress Concern Present  . Feeling of Stress : Not at all  Social Connections: Severely Isolated  . Frequency of Communication with Friends and Family: Once a week  . Frequency of Social Gatherings with Friends and Family: Never  . Attends Religious Services: Never  . Active Member of Clubs or Organizations: No  . Attends Banker Meetings: Never  . Marital Status: Widowed  Intimate Partner Violence: Not At Risk  . Fear of Current or Ex-Partner: No  . Emotionally Abused: No  . Physically Abused: No  . Sexually Abused: No   Family History  Problem Relation Age of Onset  . Cancer Other        family history   . Arthritis Other        family history   . Heart attack Mother 26  . Stroke Father 90  . Macular degeneration Sister   . 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 aspirin EC 325 MG EC tablet Take 1 tablet (325 mg total) by mouth daily.  Roseanne Kaufman [place]-Castor Oil (VENELEX) OINT Apply to sacrum and bilateral buttocks qshift & prn for prevention.  . magnesium hydroxide (MILK OF MAGNESIA) 400 MG/5ML suspension Take 30 mLs by mouth daily as needed for mild constipation.  . NON FORMULARY Diet type:  Regular, NAS, Consistent Carbohydrate  . Nutritional Supplements (ENSURE ENLIVE PO) Give 120 ml by mouth two times daily between meals   No facility-administered encounter medications on file as of 01/25/2020.     SIGNIFICANT DIAGNOSTIC EXAMS   LABS REVIEWED PREVIOUS:   01-18-19: glucose 164 bun 27; creat 1.40 k+ 3.7; na++ 141 ca 9.1 06-08-19:  wbc 6.2; hgb 12.0; hct 39.3 mcv 100.3 plt 220; glucose 89; bun 27; creat 1.20; k+ 4.1; na++ 141; ca 8.8; liver normal albumin 3.4   TODAY  12-24-19: wbc 7.9; hgb 13.0; hct 41.7; mcv 99.3 plt 221; glucose 106; bun 28; creat 1.40 ;k+ 4.0; na++ 138; ca 9.1 liver normal albumin 3.6   Review of Systems  Unable to perform ROS: Dementia (unable to participate )    Physical Exam Constitutional:      General: Lynn Mcintosh is not in acute distress.    Appearance: Lynn Mcintosh is well-developed. Lynn Mcintosh is not diaphoretic.  Neck:     Thyroid: No thyromegaly.  Cardiovascular:     Rate and Rhythm: Normal rate and regular rhythm.     Pulses: Normal pulses.     Heart sounds: Murmur present.     Comments: 3/6 Pulmonary:     Effort: Pulmonary effort is normal. No respiratory distress.     Breath sounds: Normal breath sounds.  Abdominal:     General: Bowel sounds are normal. There is no distension.     Palpations: Abdomen is soft.     Tenderness: There is no abdominal tenderness.  Musculoskeletal:        General: Normal range of motion.     Cervical back: Neck supple.     Right lower leg: No edema.     Left lower leg: No edema.  Lymphadenopathy:     Cervical: No cervical adenopathy.  Skin:    General: Skin is warm and dry.  Neurological:     Mental Status: Lynn Mcintosh is alert. Mental status is at baseline.  Psychiatric:        Mood and Affect: Mood normal.        ASSESSMENT/ PLAN:  TODAY  1. 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 Depression major single episode moderate: is stable is off zoloft will monitor  3. Late onset alzheimer's disease without significant change in status weight is 131 pounds weight loss is an unfortunate outcome in the late stages of this disease process   PREVIOUS   4. Mixed hyperlipidemia: is stable LDL 153 will not make changes will monitor   5. Hypokalemia: is stable k+ 4.0  will  monitor   6. Weight loss non-intentional: weight 02-06-19: 147 pounds  current weight is 131 is stable will continue supplements as directed and will monitor   7. Essential hypertension; is stable b/p 112/68 will continue to monitor  MD is aware of resident's narcotic use and is in agreement with current plan of care. We will attempt to wean resident as appropriate.  Shirleymae Hauth NP [facility]  Contact [phone] Monday through Friday 8am- 5pm  After hours call [phone]   

## 2020-01-30 NOTE — Progress Notes (Signed)
[facility] Room Number: 222 Place of Service:  SNF (31)   CODE STATUS: dnr  No Known Allergies  Chief Complaint  Patient presents with  . Medical Management of Chronic Issues        HPI:    Past Medical History:  Diagnosis Date  . Dementia without behavioral disturbance ([facility]) 04/16/2016   mild with MMSE 24/30 as of 04/16/16  . Diverticulitis   . Hyperlipemia   . Hypertension   . Macular degeneration   . OA (osteoarthritis)     Past Surgical History:  Procedure Laterality Date  . APPENDECTOMY    . TONSILLECTOMY AND ADENOIDECTOMY      Social History   Socioeconomic History  . Marital status: Widowed    Spouse name: Not on file  . Number of children: Not on file  . Years of education: 13 yrs   . Highest education level: Not on file  Occupational History  . Occupation: retired  Tobacco Use  . Smoking status: Never Smoker  . Smokeless tobacco: Never Used  Substance and Sexual Activity  . Alcohol use: No  . Drug use: No  . Sexual activity: Not Currently  Other Topics Concern  . Not on file  Social History Narrative   Is a long term resident of [facility]    Social Determinants of Health   Financial Resource Strain: Low Risk   . Difficulty of Paying Living Expenses: Not hard at all  Food Insecurity: No Food Insecurity  . Worried About Programme researcher, broadcasting/film/video in the Last Year: Never true  . Ran Out of Food in the Last Year: Never true  Transportation Needs: No Transportation Needs  . Lack of Transportation (Medical): No  . Lack of Transportation (Non-Medical): No  Physical Activity: Inactive  . Days of Exercise per Week: 0 days  . Minutes of Exercise per Session: 0 min  Stress: No Stress Concern Present  . Feeling of Stress : Not at all  Social Connections: Severely Isolated  . Frequency of Communication with Friends and Family: Once a week  . Frequency of Social Gatherings with Friends and Family: Never  . Attends Religious Services: Never   . Active Member of Clubs or Organizations: No  . Attends Banker Meetings: Never  . Marital Status: Widowed  Intimate Partner Violence: Not At Risk  . Fear of Current or Ex-Partner: No  . Emotionally Abused: No  . Physically Abused: No  . Sexually Abused: No   Family History  Problem Relation Age of Onset  . Cancer Other        family history   . Arthritis Other        family history   . Heart attack Mother 76  . Stroke Father 78  . Macular degeneration Sister   . 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 aspirin EC 325 MG EC tablet Take 1 tablet (325 mg total) by mouth daily.  01/27/2020 [place]-Castor Oil (VENELEX) OINT Apply to sacrum and bilateral buttocks qshift & prn for prevention.  . magnesium hydroxide (  MILK OF MAGNESIA) 400 MG/5ML suspension Take 30 mLs by mouth daily as needed for mild constipation.  . NON FORMULARY Diet type:  Regular, NAS, Consistent Carbohydrate  . Nutritional Supplements (ENSURE ENLIVE PO) Give 120 ml by mouth two times daily between meals   No facility-administered encounter medications on file as of 01/25/2020.     SIGNIFICANT DIAGNOSTIC EXAMS       ASSESSMENT/ PLAN:    MD is aware of resident's narcotic use and is in agreement with current plan of care. We will attempt to wean resident as appropriate.  Ok Edwards NP [facility]  Contact [phone] Monday through Friday 8am- 5pm  After hours call [phone]   This encounter was created in error - please disregard.

## 2020-02-20 ENCOUNTER — Non-Acute Institutional Stay (SKILLED_NURSING_FACILITY): Payer: Medicare PPO | Admitting: Adult Health

## 2020-02-20 ENCOUNTER — Encounter: Payer: Self-pay | Admitting: Adult Health

## 2020-02-20 DIAGNOSIS — E785 Hyperlipidemia, unspecified: Secondary | ICD-10-CM

## 2020-02-20 DIAGNOSIS — E876 Hypokalemia: Secondary | ICD-10-CM

## 2020-02-20 DIAGNOSIS — R634 Abnormal weight loss: Secondary | ICD-10-CM

## 2020-02-20 NOTE — Progress Notes (Signed)
Location:    [facility] Room Number: 142/D Place of Service:  SNF (31)   CODE STATUS: DNR  No Known Allergies  Chief Complaint  Patient presents with  . Medical Management of Chronic Issues          Mixed hyperlipidemia:   Hypokalemia   Weight loss: non-intentional    HPI:  She is a 86 year old long term resident of this facility being seen for the management of her chronic illnesses: hyperlipidemia; hypokalemia; weight loss. She is slowly losing weight. No reports of uncontrolled pain; no reports of agitation or anxiety.   Past Medical History:  Diagnosis Date  . Dementia without behavioral disturbance ([place]) 04/16/2016   mild with MMSE 24/30 as of 04/16/16  . Diverticulitis   . Hyperlipemia   . Hypertension   . Macular degeneration   . OA (osteoarthritis)     Past Surgical History:  Procedure Laterality Date  . APPENDECTOMY    . TONSILLECTOMY AND ADENOIDECTOMY      Social History   Socioeconomic History  . Marital status: Widowed    Spouse name: Not on file  . Number of children: Not on file  . Years of education: 13 yrs   . Highest education level: Not on file  Occupational History  . Occupation: retired  Tobacco Use  . Smoking status: Never Smoker  . Smokeless tobacco: Never Used  Substance and Sexual Activity  . Alcohol use: No  . Drug use: No  . Sexual activity: Not Currently  Other Topics Concern  . Not on file  Social History Narrative   Is a long term resident of [facility]    Social Determinants of Health   Financial Resource Strain: Low Risk   . Difficulty of Paying Living Expenses: Not hard at all  Food Insecurity: No Food Insecurity  . Worried About Charity fundraiser in the Last Year: Never true  . Ran Out of Food in the Last Year: Never true  Transportation Needs: No Transportation Needs  . Lack of Transportation (Medical): No  . Lack of Transportation (Non-Medical): No  Physical Activity: Inactive  . Days of Exercise  per Week: 0 days  . Minutes of Exercise per Session: 0 min  Stress: No Stress Concern Present  . Feeling of Stress : Not at all  Social Connections: Severely Isolated  . Frequency of Communication with Friends and Family: Once a week  . Frequency of Social Gatherings with Friends and Family: Never  . Attends Religious Services: Never  . Active Member of Clubs or Organizations: No  . Attends Archivist Meetings: Never  . Marital Status: Widowed  Intimate Partner Violence: Not At Risk  . Fear of Current or Ex-Partner: No  . Emotionally Abused: No  . Physically Abused: No  . Sexually Abused: No   Family History  Problem Relation Age of Onset  . Cancer Other        family history   . Arthritis Other        family history   . Heart attack Mother 47  . Stroke Father 54  . Macular degeneration Sister   . 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 aspirin EC 325 MG EC tablet Take 1 tablet (325 mg total) by mouth daily.  Lucilla Lame [place]-Castor Oil (VENELEX) OINT Apply to sacrum and bilateral buttocks qshift & prn for prevention.  . magnesium hydroxide (MILK OF MAGNESIA) 400 MG/5ML suspension Take 30 mLs by mouth daily as needed for mild constipation.  . NON FORMULARY Diet type:  Regular, NAS, Consistent Carbohydrate  . Nutritional Supplements (ENSURE ENLIVE PO) Give 120 ml by mouth two times daily between meals   No facility-administered encounter medications on file as of 02/20/2020.     SIGNIFICANT DIAGNOSTIC EXAMS   LABS REVIEWED PREVIOUS:   06-08-19: wbc 6.2; hgb 12.0; hct 39.3 mcv 100.3 plt 220; glucose 89; bun 27; creat 1.20; k+ 4.1; na++ 141; ca 8.8; liver normal albumin 3.4  12-24-19: wbc 7.9; hgb  13.0; hct 41.7; mcv 99.3 plt 221; glucose 106; bun 28; creat 1.40 ;k+ 4.0; na++ 138; ca 9.1 liver normal albumin 3.6   NO NEW LABS.   Review of Systems  Unable to perform ROS: Dementia (unable to participate )   Physical Exam Constitutional:      General: She is not in acute distress.    Appearance: She is well-developed. She is not diaphoretic.  Neck:     Thyroid: No thyromegaly.  Cardiovascular:     Rate and Rhythm: Normal rate and regular rhythm.     Pulses: Normal pulses.     Heart sounds: Murmur heard.      Comments: 3/6 Pulmonary:     Effort: Pulmonary effort is normal. No respiratory distress.     Breath sounds: Normal breath sounds.  Abdominal:     General: Bowel sounds are normal. There is no distension.     Palpations: Abdomen is soft.     Tenderness: There is no abdominal tenderness.  Musculoskeletal:        General: Normal range of motion.     Cervical back: Neck supple.     Right lower leg: No edema.     Left lower leg: No edema.  Lymphadenopathy:     Cervical: No cervical adenopathy.  Skin:    General: Skin is warm and dry.  Neurological:     Mental Status: She is alert. Mental status is at baseline.  Psychiatric:        Mood and Affect: Mood normal.         ASSESSMENT/ PLAN:  TODAY  1. Mixed hyperlipidemia: is stable LDL 153 will not make changes will monitor  2. Hypokalemia is stable k+ 4.0 will monitor  3. Weight loss: non-intentional: weight 02-06-19: 147 pounds current weight 129 pounds will continue supplements as directed; weight loss is an unfortunate outcome at the late stages of dementia.    PREVIOUS   4. Essential hypertension; is stable b/p 104/85 will continue to monitor   5. Cerebrovascular accident (CVA) due to bilateral embolism of middle cerebral arteries: is stable will continue asa 325 mg daily   6. Depression major single episode moderate: is stable is off zoloft will monitor  7. Late onset alzheimer's disease without  significant change in status weight is 129 pounds weight loss is an unfortunate outcome in the late stages of this disease process         MD is aware of resident's narcotic use and is in agreement with current plan of  care. We will attempt to wean resident as appropriate.  Ok Edwards NP [facility]  Contact [phone] Monday through Friday 8am- 5pm  After hours call [phone]

## 2020-03-21 ENCOUNTER — Non-Acute Institutional Stay (SKILLED_NURSING_FACILITY): Payer: Medicare PPO | Admitting: Adult Health

## 2020-03-21 ENCOUNTER — Encounter: Payer: Self-pay | Admitting: Adult Health

## 2020-03-21 DIAGNOSIS — F321 Major depressive disorder, single episode, moderate: Secondary | ICD-10-CM | POA: Diagnosis not present

## 2020-03-21 DIAGNOSIS — I10 Essential (primary) hypertension: Secondary | ICD-10-CM

## 2020-03-21 DIAGNOSIS — I63413 Cerebral infarction due to embolism of bilateral middle cerebral arteries: Secondary | ICD-10-CM

## 2020-03-21 NOTE — Progress Notes (Signed)
Location:    [facility] Room Number: 142/D Place of Service:  SNF (31)   CODE STATUS: DNR  No Known Allergies  Chief Complaint  Patient presents with  . Medical Management of Chronic Issues         Essential hypertension:    Cerebrovascular accident (CVA) due to bilateral embolism of middle cerebral arteries:    Depression major single episode    HPI:  She is a 86 year old long term resident of this facility being seen for the management of her chronic illnesses: hypertension; cva; depression. There are no reports of uncontrolled pain; she does have some agitation at times with nursing care. There are no reports of insomnia; no reports of constipation.   Past Medical History:  Diagnosis Date  . Dementia without behavioral disturbance ([facility]) 04/16/2016   mild with MMSE 24/30 as of 04/16/16  . Diverticulitis   . Hyperlipemia   . Hypertension   . Macular degeneration   . OA (osteoarthritis)     Past Surgical History:  Procedure Laterality Date  . APPENDECTOMY    . TONSILLECTOMY AND ADENOIDECTOMY      Social History   Socioeconomic History  . Marital status: Widowed    Spouse name: Not on file  . Number of children: Not on file  . Years of education: 13 yrs   . Highest education level: Not on file  Occupational History  . Occupation: retired  Tobacco Use  . Smoking status: Never Smoker  . Smokeless tobacco: Never Used  Vaping Use  . Vaping Use: Never used  Substance and Sexual Activity  . Alcohol use: No  . Drug use: No  . Sexual activity: Not Currently  Other Topics Concern  . Not on file  Social History Narrative   Is a long term resident of [facility]    Social Determinants of Health   Financial Resource Strain: Low Risk   . Difficulty of Paying Living Expenses: Not hard at all  Food Insecurity: No Food Insecurity  . Worried About Programme researcher, broadcasting/film/video in the Last Year: Never true  . Ran Out of Food in the Last Year: Never true   Transportation Needs: No Transportation Needs  . Lack of Transportation (Medical): No  . Lack of Transportation (Non-Medical): No  Physical Activity: Inactive  . Days of Exercise per Week: 0 days  . Minutes of Exercise per Session: 0 min  Stress: No Stress Concern Present  . Feeling of Stress : Not at all  Social Connections: Socially Isolated  . Frequency of Communication with Friends and Family: Once a week  . Frequency of Social Gatherings with Friends and Family: Never  . Attends Religious Services: Never  . Active Member of Clubs or Organizations: No  . Attends Banker Meetings: Never  . Marital Status: Widowed  Intimate Partner Violence: Not At Risk  . Fear of Current or Ex-Partner: No  . Emotionally Abused: No  . Physically Abused: No  . Sexually Abused: No   Family History  Problem Relation Age of Onset  . Cancer Other        family history   . Arthritis Other        family history   . Heart attack Mother 58  . Stroke Father 54  . Macular degeneration Sister   . 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 aspirin EC 325 MG EC tablet Take 1 tablet (325 mg total) by mouth daily.  05/22/2020 [place]-Castor Oil (VENELEX) OINT Apply to sacrum and bilateral buttocks qshift & prn for prevention.  . magnesium hydroxide (MILK OF MAGNESIA) 400 MG/5ML suspension Take 30 mLs by mouth daily as needed for mild constipation.  . NON FORMULARY Diet type:  Regular, NAS, Consistent Carbohydrate  . Nutritional Supplements (ENSURE ENLIVE PO) Give 120 ml by mouth two times daily between meals   No facility-administered encounter medications on file as of 03/21/2020.     SIGNIFICANT DIAGNOSTIC EXAMS  LABS REVIEWED PREVIOUS:    06-08-19: wbc 6.2; hgb 12.0; hct 39.3 mcv 100.3 plt 220; glucose 89; bun 27; creat 1.20; k+ 4.1; na++ 141; ca 8.8; liver normal albumin 3.4  12-24-19: wbc 7.9; hgb 13.0; hct 41.7; mcv 99.3 plt 221; glucose 106; bun 28; creat 1.40 ;k+ 4.0; na++ 138; ca 9.1 liver normal albumin 3.6   NO NEW LABS.   Review of Systems  Unable to perform ROS: Dementia (unable to participate )    Physical Exam Constitutional:      General: She is not in acute distress.    Appearance: She is well-developed. She is not diaphoretic.     Comments: thin  Neck:     Thyroid: No thyromegaly.  Cardiovascular:     Rate and Rhythm: Normal rate and regular rhythm.     Pulses: Normal pulses.     Heart sounds: Murmur heard.      Comments: 3/6 Pulmonary:     Effort: Pulmonary effort is normal. No respiratory distress.     Breath sounds: Normal breath sounds.  Abdominal:     General: Bowel sounds are normal. There is no distension.     Palpations: Abdomen is soft.     Tenderness: There is no abdominal tenderness.  Musculoskeletal:        General: Normal range of motion.     Cervical back: Neck supple.     Right lower leg: No edema.     Left lower leg: No edema.  Lymphadenopathy:     Cervical: No cervical adenopathy.  Skin:    General: Skin is warm and dry.  Neurological:     Mental Status: She is alert. Mental status is at baseline.  Psychiatric:        Mood and Affect: Mood normal.    ASSESSMENT/ PLAN:  TODAY  1. Essential hypertension: is stable b/p 99/58 will continue to monitor her status.   2. 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 Depression major single episode: is stable is off zoloft   PREVIOUS   4. Late onset alzheimer's disease without significant change in status weight is 126 pounds weight loss is an unfortunate outcome in the late stages of this disease process   5. Mixed hyperlipidemia: is  stable LDL 153 will not make changes will monitor  6. Hypokalemia is stable k+ 4.0 will monitor  7. Weight loss: non-intentional: weight 03-28-19 141 pounds current weight 126 pounds will continue supplements as directed; weight loss is an unfortunate outcome at the  late stages of dementia.           MD is aware of resident's narcotic use and is in agreement with current plan of care. We will attempt to wean resident as appropriate.  Synthia Innocent NP [facility]  Contact [phone] Monday through Friday 8am- 5pm  After hours call [phone]

## 2020-03-27 ENCOUNTER — Non-Acute Institutional Stay (SKILLED_NURSING_FACILITY): Payer: Medicare PPO | Admitting: Adult Health

## 2020-03-27 ENCOUNTER — Encounter: Payer: Self-pay | Admitting: Adult Health

## 2020-03-27 DIAGNOSIS — I63413 Cerebral infarction due to embolism of bilateral middle cerebral arteries: Secondary | ICD-10-CM

## 2020-03-27 DIAGNOSIS — G301 Alzheimer's disease with late onset: Secondary | ICD-10-CM

## 2020-03-27 DIAGNOSIS — F321 Major depressive disorder, single episode, moderate: Secondary | ICD-10-CM | POA: Diagnosis not present

## 2020-03-27 DIAGNOSIS — F0280 Dementia in other diseases classified elsewhere without behavioral disturbance: Secondary | ICD-10-CM | POA: Diagnosis not present

## 2020-03-27 NOTE — Progress Notes (Signed)
Location:    [facility] Room Number: 142/D Place of Service:  SNF (31)   CODE STATUS: DNR  No Known Allergies  Chief Complaint  Patient presents with  . Acute Visit    Care Plan Meeting    HPI:  We have come together for her care plan meeting. BIMS 4/15 mood 0/30. No reports of falls. She has lost 7 pounds over the past 3 months and 11 pounds over the past 6 months. Weight loss is an unfortunate outcome in the later stages of dementia. Her po intake is variable. She requires extensive assistance with her adls and is incontinent of bladder and bowel. She does attend activities. She can be aggressive with staff during nursing care. She continues to be followed for her chronic illnesses including: Late onset alzheimer's disease without behavioral disturbance  Major depression recurrent chronic  Cerebrovascular accident (CVA) due to bilateral embolism of middle cerebral arteries  Past Medical History:  Diagnosis Date  . Dementia without behavioral disturbance ([facility]) 04/16/2016   mild with MMSE 24/30 as of 04/16/16  . Diverticulitis   . Hyperlipemia   . Hypertension   . Macular degeneration   . OA (osteoarthritis)     Past Surgical History:  Procedure Laterality Date  . APPENDECTOMY    . TONSILLECTOMY AND ADENOIDECTOMY      Social History   Socioeconomic History  . Marital status: Widowed    Spouse name: Not on file  . Number of children: Not on file  . Years of education: 13 yrs   . Highest education level: Not on file  Occupational History  . Occupation: retired  Tobacco Use  . Smoking status: Never Smoker  . Smokeless tobacco: Never Used  Vaping Use  . Vaping Use: Never used  Substance and Sexual Activity  . Alcohol use: No  . Drug use: No  . Sexual activity: Not Currently  Other Topics Concern  . Not on file  Social History Narrative   Is a long term resident of [facility]    Social Determinants of Health   Financial Resource Strain: Low Risk    . Difficulty of Paying Living Expenses: Not hard at all  Food Insecurity: No Food Insecurity  . Worried About Programme researcher, broadcasting/film/video in the Last Year: Never true  . Ran Out of Food in the Last Year: Never true  Transportation Needs: No Transportation Needs  . Lack of Transportation (Medical): No  . Lack of Transportation (Non-Medical): No  Physical Activity: Inactive  . Days of Exercise per Week: 0 days  . Minutes of Exercise per Session: 0 min  Stress: No Stress Concern Present  . Feeling of Stress : Not at all  Social Connections: Socially Isolated  . Frequency of Communication with Friends and Family: Once a week  . Frequency of Social Gatherings with Friends and Family: Never  . Attends Religious Services: Never  . Active Member of Clubs or Organizations: No  . Attends Banker Meetings: Never  . Marital Status: Widowed  Intimate Partner Violence: Not At Risk  . Fear of Current or Ex-Partner: No  . Emotionally Abused: No  . Physically Abused: No  . Sexually Abused: No   Family History  Problem Relation Age of Onset  . Cancer Other        family history   . Arthritis Other        family history   . Heart attack Mother 83  . Stroke Father  95  . Macular degeneration Sister   . 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 aspirin EC 325 MG EC tablet Take 1 tablet (325 mg total) by mouth daily.  03/29/2020 [place]-Castor Oil (VENELEX) OINT Apply to sacrum and bilateral buttocks qshift & prn for prevention.  . magnesium hydroxide (MILK OF MAGNESIA) 400 MG/5ML suspension Take 30 mLs by mouth daily as needed for mild constipation.  . NON FORMULARY Diet type:  Regular, NAS, Consistent Carbohydrate  . Nutritional  Supplements (ENSURE ENLIVE PO) Give 120 ml by mouth two times daily between meals   No facility-administered encounter medications on file as of 03/27/2020.     SIGNIFICANT DIAGNOSTIC EXAMS   LABS REVIEWED PREVIOUS:   06-08-19: wbc 6.2; hgb 12.0; hct 39.3 mcv 100.3 plt 220; glucose 89; bun 27; creat 1.20; k+ 4.1; na++ 141; ca 8.8; liver normal albumin 3.4  12-24-19: wbc 7.9; hgb 13.0; hct 41.7; mcv 99.3 plt 221; glucose 106; bun 28; creat 1.40 ;k+ 4.0; na++ 138; ca 9.1 liver normal albumin 3.6   NO NEW LABS.   Review of Systems  Unable to perform ROS: Dementia (unable to participate )   Physical Exam Constitutional:      General: She is not in acute distress.    Appearance: She is well-developed. She is not diaphoretic.     Comments: thin  Neck:     Thyroid: No thyromegaly.  Cardiovascular:     Rate and Rhythm: Normal rate and regular rhythm.     Pulses: Normal pulses.     Heart sounds: Murmur heard.      Comments: 3/6 Pulmonary:     Effort: Pulmonary effort is normal. No respiratory distress.     Breath sounds: Normal breath sounds.  Abdominal:     General: Bowel sounds are normal. There is no distension.     Palpations: Abdomen is soft.     Tenderness: There is no abdominal tenderness.  Musculoskeletal:        General: Normal range of motion.     Cervical back: Neck supple.     Right lower leg: No edema.     Left lower leg: No edema.  Lymphadenopathy:     Cervical: No cervical adenopathy.  Skin:    General: Skin is warm and dry.  Neurological:     Mental Status: She is alert. Mental status is at baseline.  Psychiatric:        Mood and Affect: Mood normal.      ASSESSMENT/ PLAN:  TODAY  1. Late onset alzheimer's disease without behavioral disturbance 2. Major depression recurrent chronic 3. Cerebrovascular accident (CVA) due to bilateral embolism of middle cerebral arteries  Will continue current medications Will continue current plan of care Will  continue monitor her status.   MD is aware of resident's narcotic use and is in agreement with current plan of care. We will attempt to wean resident as appropriate.  03-31-1981 NP [facility]  Contact [phone] Monday through Friday 8am- 5pm  After hours call [phone]

## 2020-04-16 DIAGNOSIS — H524 Presbyopia: Secondary | ICD-10-CM | POA: Diagnosis not present

## 2020-04-16 DIAGNOSIS — H353132 Nonexudative age-related macular degeneration, bilateral, intermediate dry stage: Secondary | ICD-10-CM | POA: Diagnosis not present

## 2020-04-16 DIAGNOSIS — Z961 Presence of intraocular lens: Secondary | ICD-10-CM | POA: Diagnosis not present

## 2020-04-21 IMAGING — CR DG CHEST 2V
2 series · 2 of 2 positions shown · non-contrast
Comparison: 02/18/2017

CLINICAL DATA: Altered mental status.

EXAM:
CHEST - 2 VIEW

[chest lat]
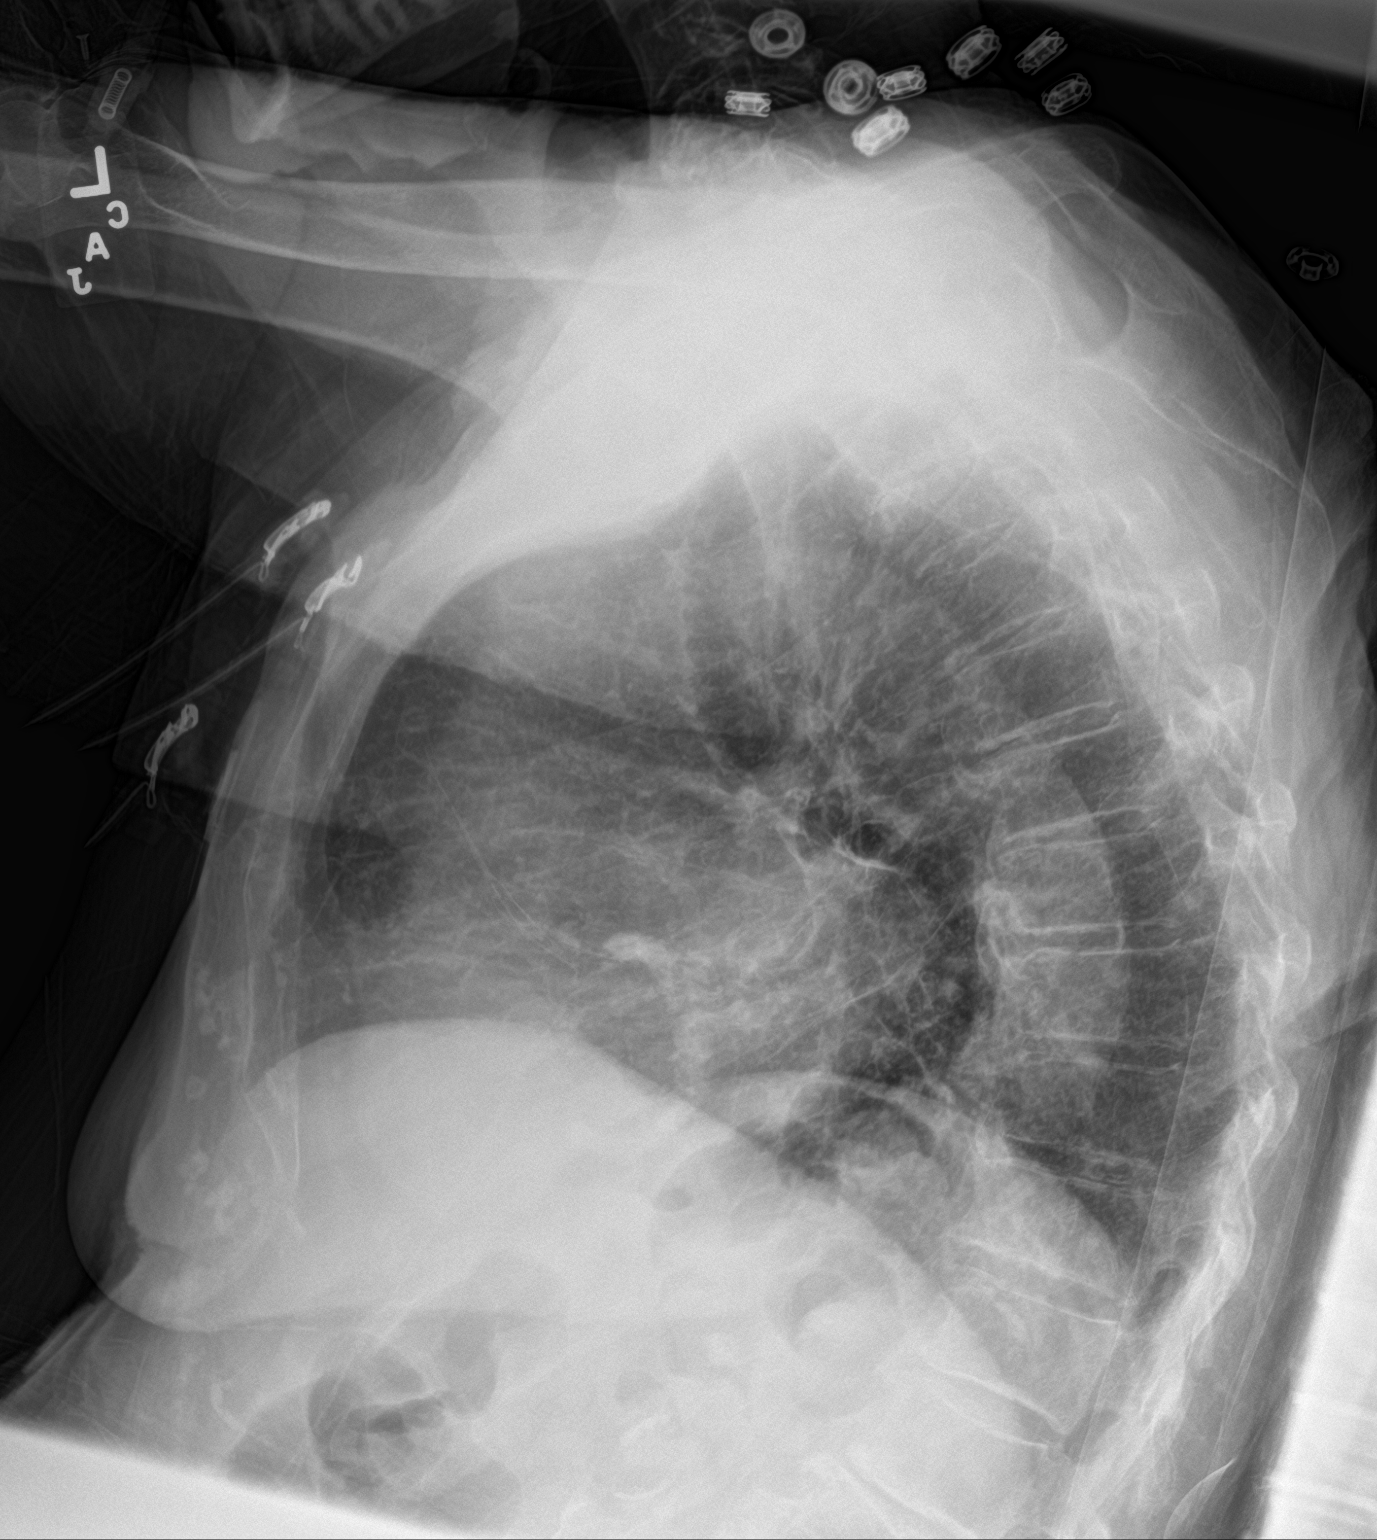

[chest ap]
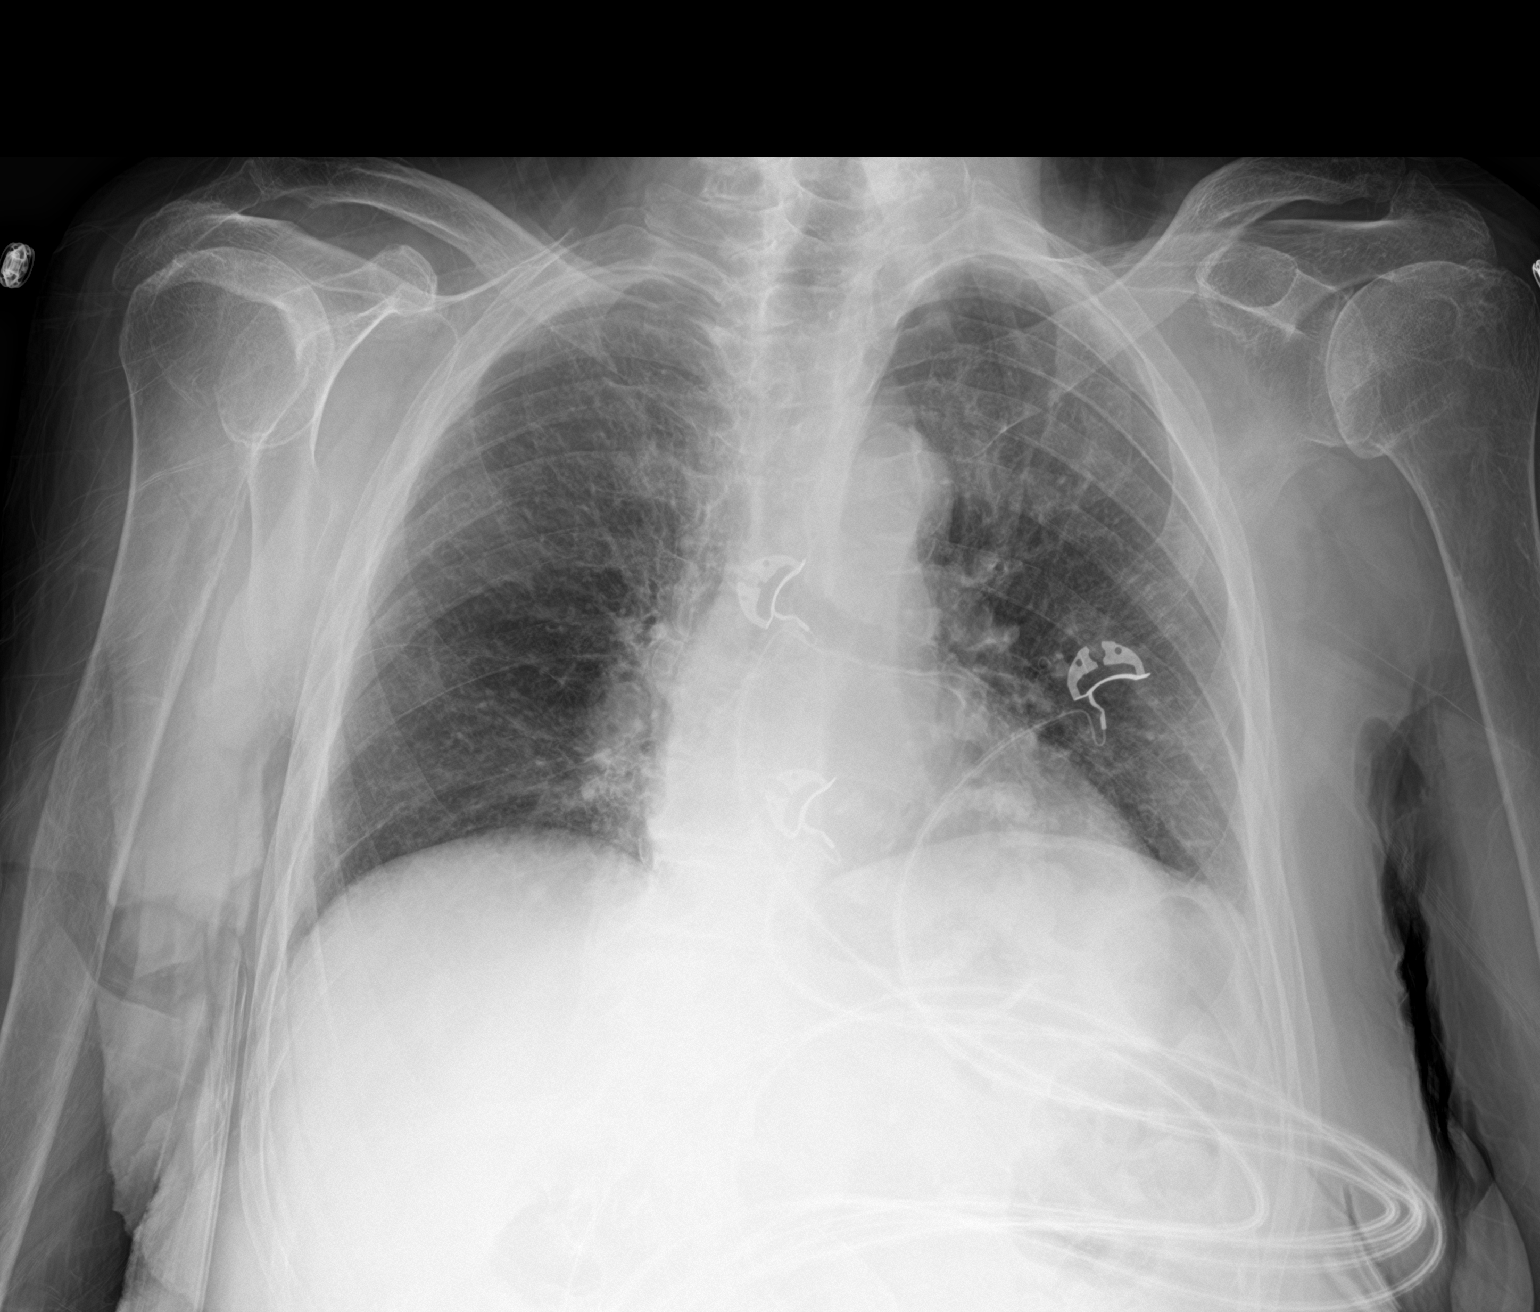

[2 of 2 positions shown; findings below may reference images not displayed]

FINDINGS: The cardiac silhouette is normal in size and configuration. No
mediastinal or hilar masses. No evidence of adenopathy.

Clear lungs.  No pleural effusion or pneumothorax.

Skeletal structures are demineralized but intact.
IMPRESSION: No active cardiopulmonary disease.

## 2020-04-22 IMAGING — MR MR HEAD W/O CM
10 of 11 series · 42 of 48 positions shown · non-contrast
Comparison: Prior CT from 09/16/2018.

CLINICAL DATA: Initial evaluation for acute altered mental status,
found down.

EXAM:
MRI HEAD WITHOUT CONTRAST
TECHNIQUE: Multiplanar, multiecho pulse sequences of the brain and surrounding
structures were obtained without intravenous contrast.

[Series 5: DWI · axial · 3.0mm · 0.88mm/px · z∈[-38,+85]mm · 9 of 92 slices shown (1 of 4)]
[im 1/92]
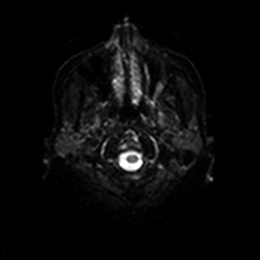
[im 12/92]
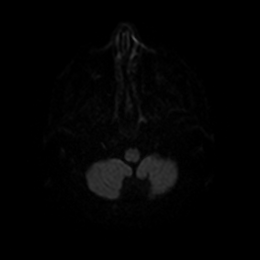
[im 23/92]
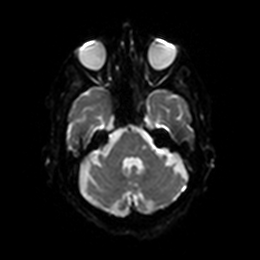
[im 35/92]
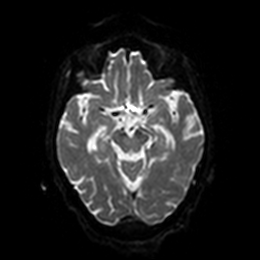
[im 46/92]
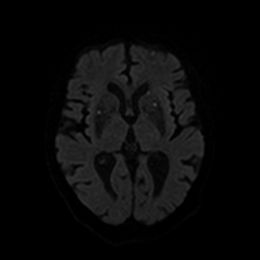
[im 57/92]
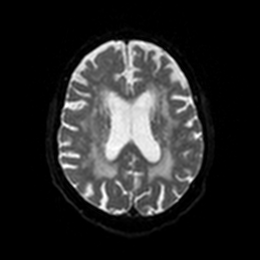
[im 69/92]
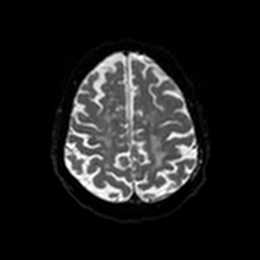
[im 80/92]
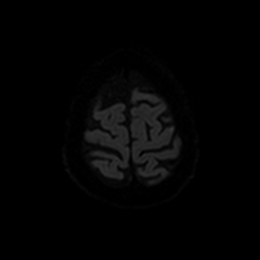
[im 92/92]
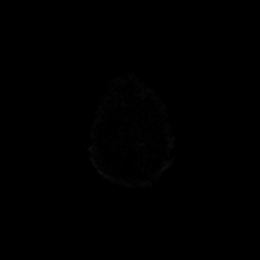

[Series 6: DWI · axial · 3.0mm · 0.88mm/px · z∈[-38,+85]mm · 4 of 46 slices shown (2 of 4)]
[im 1/46]
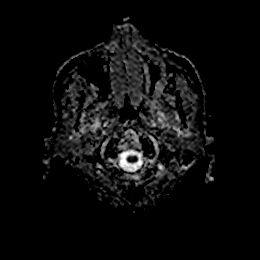
[im 16/46]
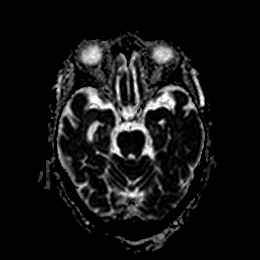
[im 31/46]
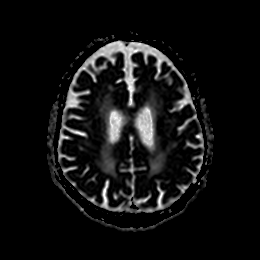
[im 46/46]
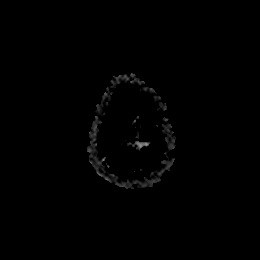

[Series 7: DWI · coronal · 4.0mm · 0.88mm/px · 6 of 68 slices shown (3 of 4)]
[im 1/68]
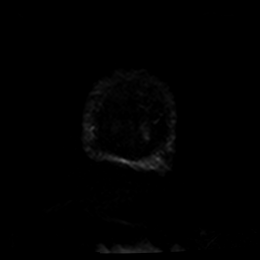
[im 14/68]
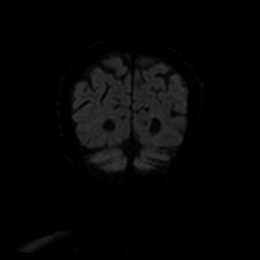
[im 27/68]
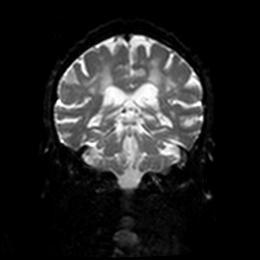
[im 41/68]
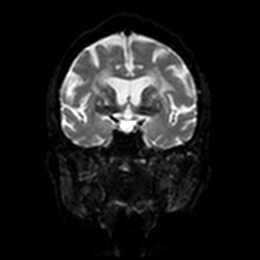
[im 54/68]
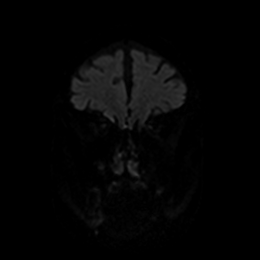
[im 68/68]
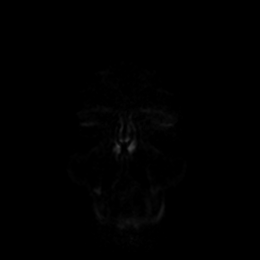

[Series 8: DWI · coronal · 4.0mm · 0.88mm/px · 3 of 34 slices shown (4 of 4)]
[im 1/34]
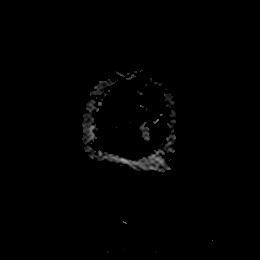
[im 17/34]
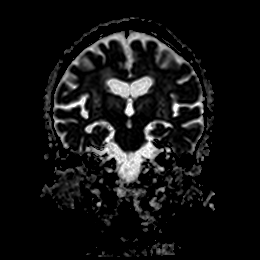
[im 34/34]
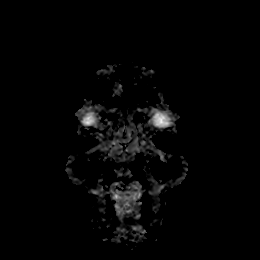

[Series 9: T1 · sagittal · 5.0mm · 0.75mm/px · 2 of 23 slices shown]
[im 1/23]
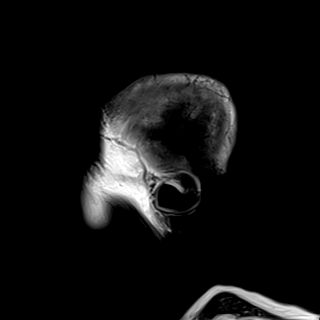
[im 23/23]
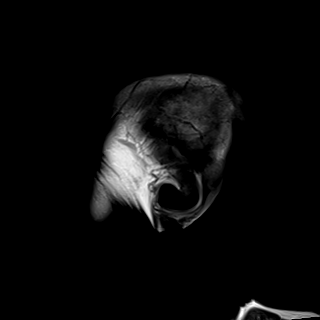

[Series 10: T2 · axial · 5.0mm · 0.72mm/px · z∈[-49,+85]mm · 2 of 25 slices shown (1 of 2)]
[im 1/25]
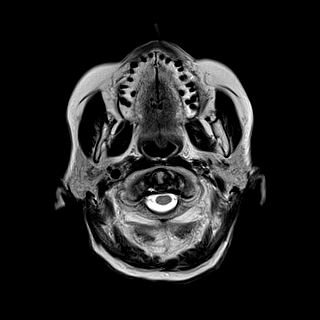
[im 25/25]
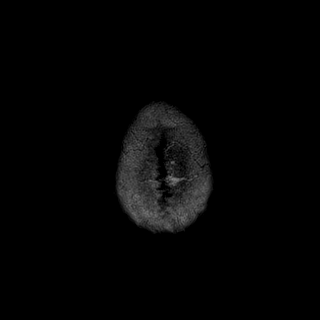

[Series 11: FLAIR · axial · 5.0mm · 0.45mm/px · z∈[-46,+87]mm · 2 of 25 slices shown]
[im 1/25]
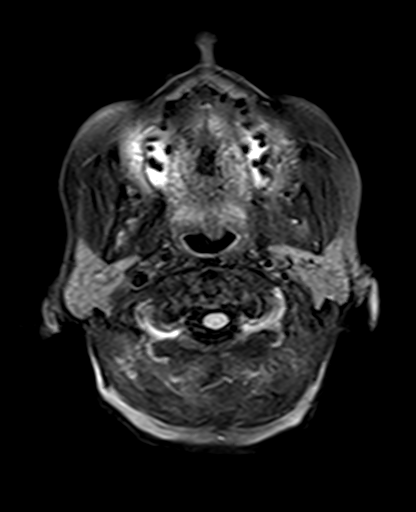
[im 25/25]
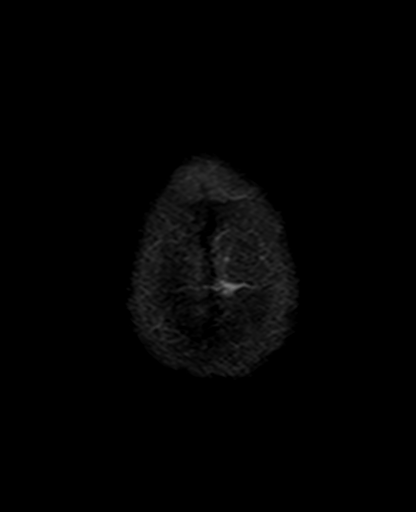

[Series 12: swi_images · axial · 3.0mm · 0.90mm/px · z∈[-61,+103]mm · 6 of 60 slices shown]
[im 1/60]
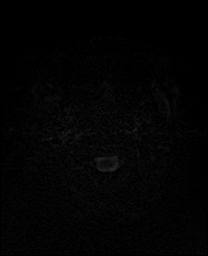
[im 12/60]
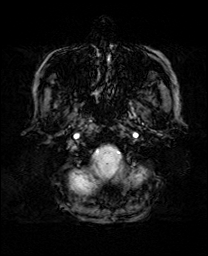
[im 24/60]
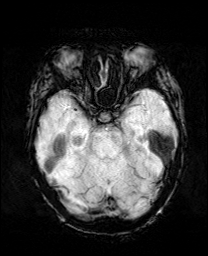
[im 36/60]
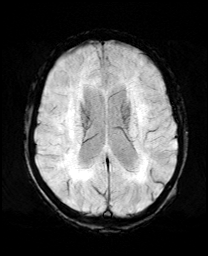
[im 48/60]
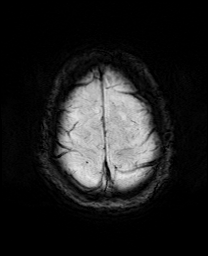
[im 60/60]
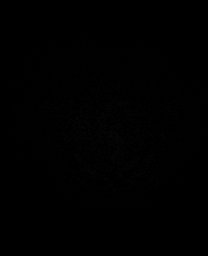

[Series 13: mip_images(sw) · axial · 24.0mm · 0.90mm/px · z∈[-52,+93]mm · 5 of 53 slices shown]
[im 1/53]
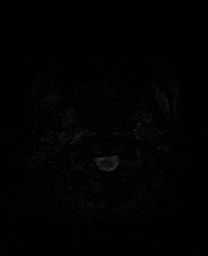
[im 14/53]
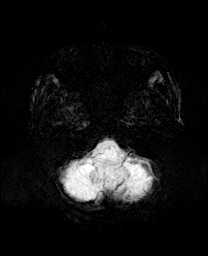
[im 27/53]
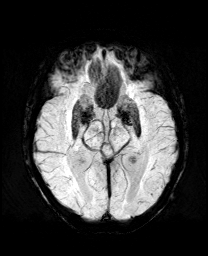
[im 40/53]
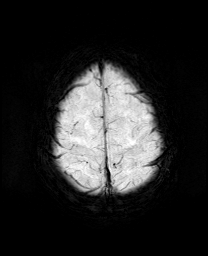
[im 53/53]
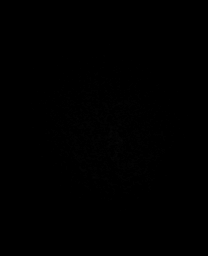

[Series 15: T2 · coronal · 5.0mm · 0.34mm/px · 3 of 30 slices shown (2 of 2)]
[im 1/30]
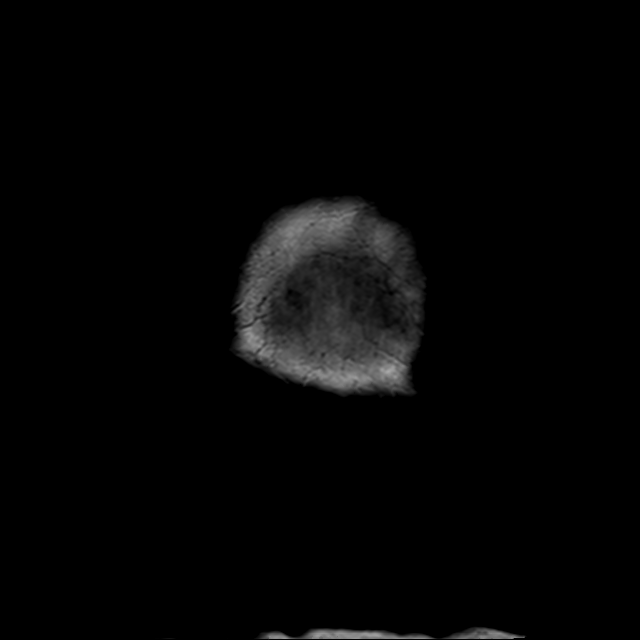
[im 15/30]
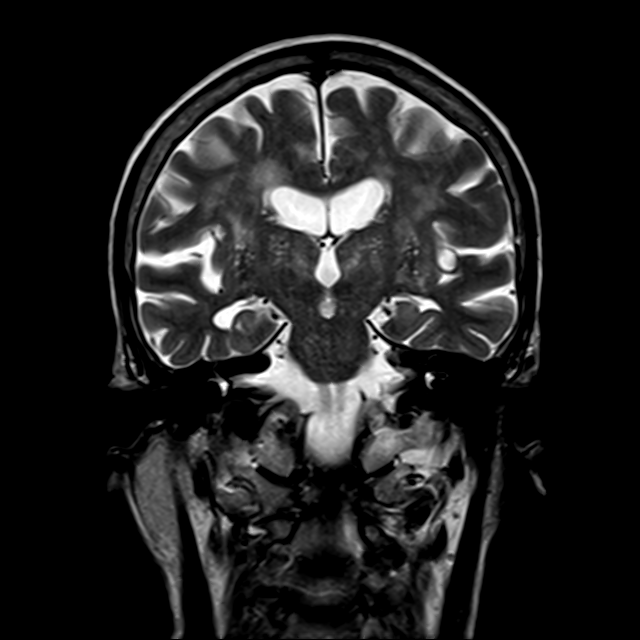
[im 30/30]
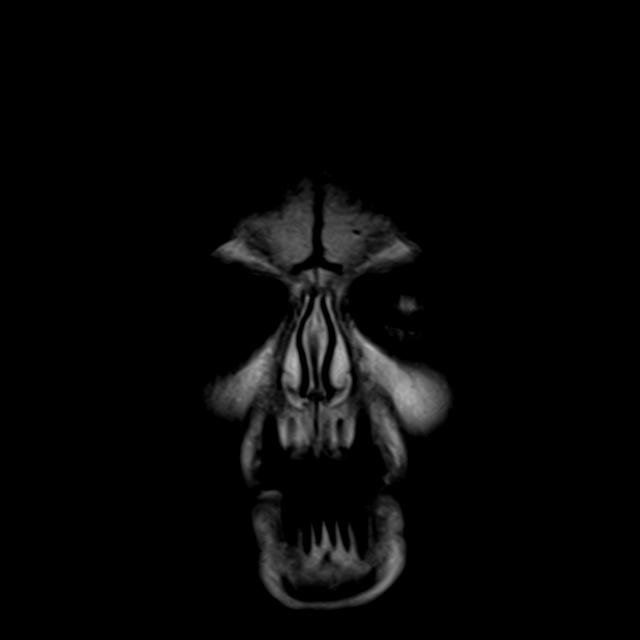

[42 of 48 positions shown; findings below may reference images not displayed]

FINDINGS: Brain: Diffuse prominence of the CSF containing spaces compatible
with generalized age-related cerebral atrophy. Patchy and confluent
T2/FLAIR hyperintensity within the periventricular and deep white
matter most consistent with chronic microvascular ischemic disease,
moderate in nature.

Multiple scattered acute ischemic infarcts seen involving the
bilateral cerebral and cerebellar hemispheres as well as the
brainstem. For reference purposes, largest infarct within the left
cerebral hemisphere located at the anterior inferior left basal
ganglia and measures 8 mm (series 5, image 67). Largest infarct on
the right cerebral hemisphere involves the postcentral gyrus and
measures 11 mm (series 5, image 79). Largest infratentorial infarct
involves the left paramedian dorsal pons and measures 15 mm in
length (series 5, image 60). No associated hemorrhage or mass
effect.

No other evidence for acute or subacute ischemia. Gray-white matter
differentiation otherwise maintained. No encephalomalacia to suggest
chronic cortical infarction. No evidence for acute or chronic
intracranial hemorrhage.

No mass lesion, midline shift or mass effect. No hydrocephalus. No
extra-axial fluid collection. Incidental note made of an empty
sella.

Vascular: Major intracranial vascular flow voids maintained.

Skull and upper cervical spine: Craniocervical junction within
normal limits. Mild cervical spondylolysis noted within the upper
cervical spine without significant stenosis. Bone marrow signal
intensity normal. No scalp soft tissue abnormality.

Sinuses/Orbits: Patient status post bilateral ocular lens
replacement. Scattered mucosal thickening throughout the paranasal
sinuses, chronic in appearance. No air-fluid level to suggest acute
sinusitis. Small bilateral mastoid effusions, of doubtful clinical
significance. Inner ear structures grossly normal.

Other: None.
IMPRESSION: 1. Multiple scattered predominantly subcentimeter acute ischemic
nonhemorrhagic infarcts involving the bilateral cerebral and
cerebellar hemispheres as well as the brainstem. A central
thromboembolic etiology is suspected.
2. Underlying age-related cerebral atrophy with moderate chronic
small vessel ischemic disease.

## 2020-04-23 ENCOUNTER — Non-Acute Institutional Stay (SKILLED_NURSING_FACILITY): Payer: Medicare PPO | Admitting: Adult Health

## 2020-04-23 ENCOUNTER — Encounter: Payer: Self-pay | Admitting: Adult Health

## 2020-04-23 DIAGNOSIS — F0280 Dementia in other diseases classified elsewhere without behavioral disturbance: Secondary | ICD-10-CM

## 2020-04-23 DIAGNOSIS — E876 Hypokalemia: Secondary | ICD-10-CM | POA: Diagnosis not present

## 2020-04-23 DIAGNOSIS — E782 Mixed hyperlipidemia: Secondary | ICD-10-CM | POA: Diagnosis not present

## 2020-04-23 DIAGNOSIS — G301 Alzheimer's disease with late onset: Secondary | ICD-10-CM | POA: Diagnosis not present

## 2020-04-23 NOTE — Progress Notes (Signed)
Location:    [facility] Room Number: 142/D Place of Service:  SNF (31)   CODE STATUS: DNR  No Known Allergies  Chief Complaint  Patient presents with  . Medical Management of Chronic Issues          Late onset alzheimer's disease    Mixed hyperlipidemia;  Hypokalemia:     HPI:  She is a 86 year old long term resident of this facility being seen for the management of her chronic illnesses: Late onset alzheimer's disease  Mixed hyperlipidemia; Hypokalemia. There are no reports of uncontrolled pain no reports of constipation; or heart burn. She does get agitated with nursing care.    Past Medical History:  Diagnosis Date  . Dementia without behavioral disturbance (HCC) 04/16/2016   mild with MMSE 24/30 as of 04/16/16  . Diverticulitis   . Hyperlipemia   . Hypertension   . Macular degeneration   . OA (osteoarthritis)     Past Surgical History:  Procedure Laterality Date  . APPENDECTOMY    . TONSILLECTOMY AND ADENOIDECTOMY      Social History   Socioeconomic History  . Marital status: Widowed    Spouse name: Not on file  . Number of children: Not on file  . Years of education: 13 yrs   . Highest education level: Not on file  Occupational History  . Occupation: retired  Tobacco Use  . Smoking status: Never Smoker  . Smokeless tobacco: Never Used  Vaping Use  . Vaping Use: Never used  Substance and Sexual Activity  . Alcohol use: No  . Drug use: No  . Sexual activity: Not Currently  Other Topics Concern  . Not on file  Social History Narrative   Is a long term resident of [facility]    Social Determinants of Health   Financial Resource Strain: Low Risk   . Difficulty of Paying Living Expenses: Not hard at all  Food Insecurity: No Food Insecurity  . Worried About Programme researcher, broadcasting/film/video in the Last Year: Never true  . Ran Out of Food in the Last Year: Never true  Transportation Needs: No Transportation Needs  . Lack of Transportation (Medical): No   . Lack of Transportation (Non-Medical): No  Physical Activity: Inactive  . Days of Exercise per Week: 0 days  . Minutes of Exercise per Session: 0 min  Stress: No Stress Concern Present  . Feeling of Stress : Not at all  Social Connections: Socially Isolated  . Frequency of Communication with Friends and Family: Once a week  . Frequency of Social Gatherings with Friends and Family: Never  . Attends Religious Services: Never  . Active Member of Clubs or Organizations: No  . Attends Banker Meetings: Never  . Marital Status: Widowed  Intimate Partner Violence: Not At Risk  . Fear of Current or Ex-Partner: No  . Emotionally Abused: No  . Physically Abused: No  . Sexually Abused: No   Family History  Problem Relation Age of Onset  . Cancer Other        family history   . Arthritis Other        family history   . Heart attack Mother 29  . Stroke Father 29  . Macular degeneration Sister   . 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 aspirin EC 325 MG EC tablet Take 1 tablet (325 mg total) by mouth daily.  06/23/2020 [place]-Castor Oil (VENELEX) OINT Apply to sacrum and bilateral buttocks qshift & prn for prevention.  . magnesium hydroxide (MILK OF MAGNESIA) 400 MG/5ML suspension Take 30 mLs by mouth daily as needed for mild constipation.  . NON FORMULARY Diet type:  Regular, NAS, Consistent Carbohydrate  . Nutritional Supplements (ENSURE ENLIVE PO) Give 120 ml by mouth two times daily between meals   No facility-administered encounter medications on file as of 04/23/2020.     SIGNIFICANT DIAGNOSTIC EXAMS  LABS REVIEWED PREVIOUS:   06-08-19: wbc 6.2; hgb 12.0; hct 39.3 mcv 100.3 plt 220; glucose 89; bun 27; creat 1.20;  k+ 4.1; na++ 141; ca 8.8; liver normal albumin 3.4  12-24-19: wbc 7.9; hgb 13.0; hct 41.7; mcv 99.3 plt 221; glucose 106; bun 28; creat 1.40 ;k+ 4.0; na++ 138; ca 9.1 liver normal albumin 3.6   NO NEW LABS.   Review of Systems  Unable to perform ROS: Dementia (unabld to participate )    Physical Exam Constitutional:      General: She is not in acute distress.    Appearance: She is well-developed. She is not diaphoretic.     Comments: thin  Neck:     Thyroid: No thyromegaly.  Cardiovascular:     Rate and Rhythm: Normal rate and regular rhythm.     Pulses: Normal pulses.     Heart sounds: Murmur heard.      Comments: 3/6 Pulmonary:     Effort: Pulmonary effort is normal. No respiratory distress.     Breath sounds: Normal breath sounds.  Abdominal:     General: Bowel sounds are normal. There is no distension.     Palpations: Abdomen is soft.     Tenderness: There is no abdominal tenderness.  Musculoskeletal:        General: Normal range of motion.     Cervical back: Neck supple.     Right lower leg: No edema.     Left lower leg: No edema.  Lymphadenopathy:     Cervical: No cervical adenopathy.  Skin:    General: Skin is warm and dry.  Neurological:     Mental Status: She is alert. Mental status is at baseline.  Psychiatric:        Mood and Affect: Mood normal.       ASSESSMENT/ PLAN:  TODAY  1. Late onset alzheimer's disease without change; her weight is presently stable at 126 pounds; will monitor   2. Mixed hyperlipidemia; is stable LDL 153 will monitor  3. Hypokalemia: is stable k+ 4.0 will monitor    PREVIOUS   4. Weight loss: non-intentional: weight 03-28-19 141 pounds current weight 126 pounds will continue supplements as directed; weight loss is an unfortunate outcome at the late stages of dementia.   5. Essential hypertension: is stable b/p 133/65 will continue to monitor her status.   6. Cerebrovascular accident (CVA) due to bilateral embolism of  middle cerebral arteries: is stable will continue asa 325 mg daily   7. Depression major single episode: is stable is off zoloft      MD is aware of resident's narcotic use and is in agreement with  current plan of care. We will attempt to wean resident as appropriate.  Ok Edwards NP [facility]  Contact [phone] Monday through Friday 8am- 5pm  After hours call [phone]

## 2020-05-02 DIAGNOSIS — Z1159 Encounter for screening for other viral diseases: Secondary | ICD-10-CM | POA: Diagnosis not present

## 2020-05-02 DIAGNOSIS — I639 Cerebral infarction, unspecified: Secondary | ICD-10-CM | POA: Diagnosis not present

## 2020-05-05 DIAGNOSIS — Z1159 Encounter for screening for other viral diseases: Secondary | ICD-10-CM | POA: Diagnosis not present

## 2020-05-05 DIAGNOSIS — I639 Cerebral infarction, unspecified: Secondary | ICD-10-CM | POA: Diagnosis not present

## 2020-05-12 DIAGNOSIS — Z1159 Encounter for screening for other viral diseases: Secondary | ICD-10-CM | POA: Diagnosis not present

## 2020-05-12 DIAGNOSIS — I639 Cerebral infarction, unspecified: Secondary | ICD-10-CM | POA: Diagnosis not present

## 2020-05-15 DIAGNOSIS — Z1159 Encounter for screening for other viral diseases: Secondary | ICD-10-CM | POA: Diagnosis not present

## 2020-05-15 DIAGNOSIS — I639 Cerebral infarction, unspecified: Secondary | ICD-10-CM | POA: Diagnosis not present

## 2020-05-20 DIAGNOSIS — Z1159 Encounter for screening for other viral diseases: Secondary | ICD-10-CM | POA: Diagnosis not present

## 2020-05-20 DIAGNOSIS — I639 Cerebral infarction, unspecified: Secondary | ICD-10-CM | POA: Diagnosis not present

## 2020-05-22 DIAGNOSIS — I639 Cerebral infarction, unspecified: Secondary | ICD-10-CM | POA: Diagnosis not present

## 2020-05-22 DIAGNOSIS — Z1159 Encounter for screening for other viral diseases: Secondary | ICD-10-CM | POA: Diagnosis not present

## 2020-05-26 DIAGNOSIS — I639 Cerebral infarction, unspecified: Secondary | ICD-10-CM | POA: Diagnosis not present

## 2020-05-26 DIAGNOSIS — Z1159 Encounter for screening for other viral diseases: Secondary | ICD-10-CM | POA: Diagnosis not present

## 2020-05-28 ENCOUNTER — Non-Acute Institutional Stay (SKILLED_NURSING_FACILITY): Payer: Medicare PPO | Admitting: Adult Health

## 2020-05-28 ENCOUNTER — Encounter: Payer: Self-pay | Admitting: Adult Health

## 2020-05-28 DIAGNOSIS — I10 Essential (primary) hypertension: Secondary | ICD-10-CM

## 2020-05-28 DIAGNOSIS — I63413 Cerebral infarction due to embolism of bilateral middle cerebral arteries: Secondary | ICD-10-CM | POA: Diagnosis not present

## 2020-05-28 DIAGNOSIS — R634 Abnormal weight loss: Secondary | ICD-10-CM

## 2020-05-28 NOTE — Progress Notes (Signed)
Location:    [facility] Room Number: 142/D Place of Service:  SNF (31)   CODE STATUS: DNR  No Known Allergies  Chief Complaint  Patient presents with  . Medical Management of Chronic Issues            Weight loss non-intentional:   Essential hypertension:  Cerebrovascular accident (CVA) due to bilateral embolism of middle cerebral arteries    HPI:  She is a 86 year old long long term resident of this facility being seen for the management of her chronic illnesses:Weight loss non-intentional:   Essential hypertension:  Cerebrovascular accident (CVA) due to bilateral embolism of middle cerebral arteries. There are no reports of agitation no reports of anxiety; no reports of uncontrolled pain.    Past Medical History:  Diagnosis Date  . Dementia without behavioral disturbance (HCC) 04/16/2016   mild with MMSE 24/30 as of 04/16/16  . Diverticulitis   . Hyperlipemia   . Hypertension   . Macular degeneration   . OA (osteoarthritis)     Past Surgical History:  Procedure Laterality Date  . APPENDECTOMY    . TONSILLECTOMY AND ADENOIDECTOMY      Social History   Socioeconomic History  . Marital status: Widowed    Spouse name: Not on file  . Number of children: Not on file  . Years of education: 13 yrs   . Highest education level: Not on file  Occupational History  . Occupation: retired  Tobacco Use  . Smoking status: Never Smoker  . Smokeless tobacco: Never Used  Vaping Use  . Vaping Use: Never used  Substance and Sexual Activity  . Alcohol use: No  . Drug use: No  . Sexual activity: Not Currently  Other Topics Concern  . Not on file  Social History Narrative   Is a long term resident of [facility]    Social Determinants of Health   Financial Resource Strain: Low Risk   . Difficulty of Paying Living Expenses: Not hard at all  Food Insecurity: No Food Insecurity  . Worried About Programme researcher, broadcasting/film/video in the Last Year: Never true  . Ran Out of Food in  the Last Year: Never true  Transportation Needs: No Transportation Needs  . Lack of Transportation (Medical): No  . Lack of Transportation (Non-Medical): No  Physical Activity: Inactive  . Days of Exercise per Week: 0 days  . Minutes of Exercise per Session: 0 min  Stress: No Stress Concern Present  . Feeling of Stress : Not at all  Social Connections: Socially Isolated  . Frequency of Communication with Friends and Family: Once a week  . Frequency of Social Gatherings with Friends and Family: Never  . Attends Religious Services: Never  . Active Member of Clubs or Organizations: No  . Attends Banker Meetings: Never  . Marital Status: Widowed  Intimate Partner Violence: Not At Risk  . Fear of Current or Ex-Partner: No  . Emotionally Abused: No  . Physically Abused: No  . Sexually Abused: No   Family History  Problem Relation Age of Onset  . Cancer Other        family history   . Arthritis Other        family history   . Heart attack Mother 44  . Stroke Father 21  . Macular degeneration Sister   . 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 aspirin EC 325 MG EC tablet Take 1 tablet (325 mg total) by mouth daily.  05/30/2020 [place]-Castor Oil (VENELEX) OINT Apply to sacrum and bilateral buttocks qshift & prn for prevention.  . NON FORMULARY Diet type:  Regular, NAS, Consistent Carbohydrate  . Nutritional Supplements (ENSURE ENLIVE PO) Give 120 ml by mouth two times daily between meals  . [DISCONTINUED] magnesium hydroxide (MILK OF MAGNESIA) 400 MG/5ML suspension Take 30 mLs by mouth daily as needed for mild constipation.   No facility-administered encounter medications on file as of 05/28/2020.      SIGNIFICANT DIAGNOSTIC EXAMS   LABS REVIEWED PREVIOUS:   06-08-19: wbc 6.2; hgb 12.0; hct 39.3 mcv 100.3 plt 220; glucose 89; bun 27; creat 1.20; k+ 4.1; na++ 141; ca 8.8; liver normal albumin 3.4  12-24-19: wbc 7.9; hgb 13.0; hct 41.7; mcv 99.3 plt 221; glucose 106; bun 28; creat 1.40 ;k+ 4.0; na++ 138; ca 9.1 liver normal albumin 3.6   NO NEW LABS.   Review of Systems  Unable to perform ROS: Dementia (unable to participate )    Physical Exam Constitutional:      General: She is not in acute distress.    Appearance: She is well-developed. She is not diaphoretic.     Comments: thin  Neck:     Thyroid: No thyromegaly.  Cardiovascular:     Rate and Rhythm: Normal rate and regular rhythm.     Pulses: Normal pulses.     Heart sounds: Murmur heard.      Comments: 3/6 Pulmonary:     Effort: Pulmonary effort is normal. No respiratory distress.     Breath sounds: Normal breath sounds.  Abdominal:     General: Bowel sounds are normal. There is no distension.     Palpations: Abdomen is soft.     Tenderness: There is no abdominal tenderness.  Musculoskeletal:        General: Normal range of motion.     Cervical back: Neck supple.     Right lower leg: No edema.     Left lower leg: No edema.  Lymphadenopathy:     Cervical: No cervical adenopathy.  Skin:    General: Skin is warm and dry.  Neurological:     Mental Status: She is alert. Mental status is at baseline.  Psychiatric:        Mood and Affect: Mood normal.     ASSESSMENT/ PLAN:  TODAY  1. Weight loss non-intentional: weight on 03-28-19: 141 pounds; current weight is 126 pounds will continue supplements as directed weight loss is an unfortunate outcome in the last stages of dementia.   2. Essential hypertension: is stable b/p 91/66 will continue to monitor her status.   3. Cerebrovascular accident (CVA) due to bilateral embolism of middle cerebral arteries: is stable will continue asa 325 mg daily     PREVIOUS   4. Depression major single episode: is stable is off zoloft   5. Late onset alzheimer's disease without change; her weight is presently stable at 126 pounds; will monitor   6. Mixed hyperlipidemia; is stable LDL 153 will monitor  7. Hypokalemia: is stable k+ 4.0 will monitor  MD is aware of resident's narcotic use and is in agreement with current plan of care. We will attempt to wean resident as appropriate.  Bodey Frizell NP [facility]  Contact [phone] Monday through Friday 8am- 5pm  After hours call [phone]   

## 2020-05-29 DIAGNOSIS — I639 Cerebral infarction, unspecified: Secondary | ICD-10-CM | POA: Diagnosis not present

## 2020-05-29 DIAGNOSIS — Z1159 Encounter for screening for other viral diseases: Secondary | ICD-10-CM | POA: Diagnosis not present

## 2020-06-02 DIAGNOSIS — Z1159 Encounter for screening for other viral diseases: Secondary | ICD-10-CM | POA: Diagnosis not present

## 2020-06-02 DIAGNOSIS — I639 Cerebral infarction, unspecified: Secondary | ICD-10-CM | POA: Diagnosis not present

## 2020-06-26 ENCOUNTER — Encounter: Payer: Self-pay | Admitting: Adult Health

## 2020-06-26 ENCOUNTER — Non-Acute Institutional Stay (SKILLED_NURSING_FACILITY): Payer: Medicare PPO | Admitting: Adult Health

## 2020-06-26 DIAGNOSIS — F0280 Dementia in other diseases classified elsewhere without behavioral disturbance: Secondary | ICD-10-CM | POA: Diagnosis not present

## 2020-06-26 DIAGNOSIS — F321 Major depressive disorder, single episode, moderate: Secondary | ICD-10-CM

## 2020-06-26 DIAGNOSIS — I63413 Cerebral infarction due to embolism of bilateral middle cerebral arteries: Secondary | ICD-10-CM | POA: Diagnosis not present

## 2020-06-26 DIAGNOSIS — G301 Alzheimer's disease with late onset: Secondary | ICD-10-CM

## 2020-06-26 NOTE — Progress Notes (Signed)
Location:    [facility] Room Number: 142/D Place of Service:  SNF (31)   CODE STATUS: DNR  No Known Allergies  Chief Complaint  Patient presents with  . Acute Visit    Care Plan Meeting    HPI:  We have come together for her care plan meeting. BIMS 5/15 mood 0/30. No falls. Her weight is at 124 pounds with a 2.8 pounds weight loss in the past quarter. She will decline supplements at times and has a poor appetite. She requires extensive assistance with her adls. Feeds self after setup. Non-ambulatory. She is frequently incontinent of bladder and bowel. She will get agitated with staff during nursing care at times. She continues to be followed for her chronic illnesses including: Major depression single episode moderate  Late onset alzheimer's disease without behavioral disturbance  Cerebrovascular accident (CVA) due to bilateral embolism of middle cerebral arteries  Past Medical History:  Diagnosis Date  . Dementia without behavioral disturbance (HCC) 04/16/2016   mild with MMSE 24/30 as of 04/16/16  . Diverticulitis   . Hyperlipemia   . Hypertension   . Macular degeneration   . OA (osteoarthritis)     Past Surgical History:  Procedure Laterality Date  . APPENDECTOMY    . TONSILLECTOMY AND ADENOIDECTOMY      Social History   Socioeconomic History  . Marital status: Widowed    Spouse name: Not on file  . Number of children: Not on file  . Years of education: 13 yrs   . Highest education level: Not on file  Occupational History  . Occupation: retired  Tobacco Use  . Smoking status: Never Smoker  . Smokeless tobacco: Never Used  Vaping Use  . Vaping Use: Never used  Substance and Sexual Activity  . Alcohol use: No  . Drug use: No  . Sexual activity: Not Currently  Other Topics Concern  . Not on file  Social History Narrative   Is a long term resident of [facility]    Social Determinants of Health   Financial Resource Strain: Low Risk   .  Difficulty of Paying Living Expenses: Not hard at all  Food Insecurity: No Food Insecurity  . Worried About Programme researcher, broadcasting/film/video in the Last Year: Never true  . Ran Out of Food in the Last Year: Never true  Transportation Needs: No Transportation Needs  . Lack of Transportation (Medical): No  . Lack of Transportation (Non-Medical): No  Physical Activity: Inactive  . Days of Exercise per Week: 0 days  . Minutes of Exercise per Session: 0 min  Stress: No Stress Concern Present  . Feeling of Stress : Not at all  Social Connections: Socially Isolated  . Frequency of Communication with Friends and Family: Once a week  . Frequency of Social Gatherings with Friends and Family: Never  . Attends Religious Services: Never  . Active Member of Clubs or Organizations: No  . Attends Banker Meetings: Never  . Marital Status: Widowed  Intimate Partner Violence: Not At Risk  . Fear of Current or Ex-Partner: No  . Emotionally Abused: No  . Physically Abused: No  . Sexually Abused: No   Family History  Problem Relation Age of Onset  . Cancer Other        family history   . Arthritis Other        family history   . Heart attack Mother 46  . Stroke Father 76  . Macular degeneration  Sister   . 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 aspirin EC 325 MG EC tablet Take 1 tablet (325 mg total) by mouth daily.  06/28/2020 [place]-Castor Oil (VENELEX) OINT Apply to sacrum and bilateral buttocks qshift & prn for prevention.  . NON FORMULARY Diet type:  Regular, NAS, Consistent Carbohydrate  . Nutritional Supplements (ENSURE ENLIVE PO) Give 120 ml by mouth two times daily between meals   No facility-administered encounter medications on file  as of 06/26/2020.     SIGNIFICANT DIAGNOSTIC EXAMS  LABS REVIEWED PREVIOUS:    12-24-19: wbc 7.9; hgb 13.0; hct 41.7; mcv 99.3 plt 221; glucose 106; bun 28; creat 1.40 ;k+ 4.0; na++ 138; ca 9.1 liver normal albumin 3.6   NO NEW LABS.   Review of Systems  Unable to perform ROS: Dementia (unable to participate )   Physical Exam Constitutional:      General: She is not in acute distress.    Appearance: She is well-developed. She is not diaphoretic.     Comments: thin  Neck:     Thyroid: No thyromegaly.  Cardiovascular:     Rate and Rhythm: Normal rate and regular rhythm.     Pulses: Normal pulses.     Heart sounds: Murmur heard.      Comments: 3/6 Pulmonary:     Effort: Pulmonary effort is normal. No respiratory distress.     Breath sounds: Normal breath sounds.  Abdominal:     General: Bowel sounds are normal. There is no distension.     Palpations: Abdomen is soft.     Tenderness: There is no abdominal tenderness.  Musculoskeletal:        General: Normal range of motion.     Cervical back: Neck supple.     Right lower leg: No edema.     Left lower leg: No edema.  Lymphadenopathy:     Cervical: No cervical adenopathy.  Skin:    General: Skin is warm and dry.  Neurological:     Mental Status: She is alert. Mental status is at baseline.  Psychiatric:        Mood and Affect: Mood normal.       ASSESSMENT/ PLAN:  TODAY  1. Major depression single episode moderate 2. Late onset alzheimer's disease without behavioral disturbance 3. Cerebrovascular accident (CVA) due to bilateral embolism of middle cerebral arteries  Will continue current medications Will continue current plan of care Will continue to monitor her status.  Will get cbc cmp.      MD is aware of resident's narcotic use and is in agreement with current plan of care. We will attempt to wean resident as appropriate.  [birth year]-31-1981 NP [facility]  Contact [phone] Monday through  Friday 8am- 5pm  After hours call [phone]

## 2020-06-30 ENCOUNTER — Encounter (HOSPITAL_COMMUNITY)
Admission: RE | Admit: 2020-06-30 | Discharge: 2020-06-30 | Disposition: A | Discharge status: Home / Self Care | Payer: Medicare PPO | Source: Skilled Nursing Facility | Attending: Adult Health | Admitting: Adult Health

## 2020-06-30 DIAGNOSIS — I10 Essential (primary) hypertension: Secondary | ICD-10-CM | POA: Diagnosis not present

## 2020-06-30 LAB — COMPREHENSIVE METABOLIC PANEL
ALT: 14 U/L (ref 0–44)
AST: 19 U/L (ref 15–41)
Albumin: 3.2 g/dL — ABNORMAL LOW (ref 3.5–5.0)
Alkaline Phosphatase: 44 U/L (ref 38–126)
Anion gap: 9 (ref 5–15)
BUN: 19 mg/dL (ref 8–23)
CO2: 23 mmol/L (ref 22–32)
Calcium: 8.6 mg/dL — ABNORMAL LOW (ref 8.9–10.3)
Chloride: 108 mmol/L (ref 98–111)
Creatinine, Ser: 1.14 mg/dL — ABNORMAL HIGH (ref 0.44–1.00)
GFR, Estimated: 40 mL/min — ABNORMAL LOW (ref >60)
Glucose, Bld: 88 mg/dL (ref 70–99)
Potassium: 3.7 mmol/L (ref 3.5–5.1)
Sodium: 140 mmol/L (ref 135–145)
Total Bilirubin: 0.5 mg/dL (ref 0.3–1.2)
Total Protein: 6 g/dL — ABNORMAL LOW (ref 6.5–8.1)

## 2020-06-30 LAB — CBC
HCT: 38.3 % (ref 36.0–46.0)
Hemoglobin: 12.2 g/dL (ref 12.0–15.0)
MCH: 31 pg (ref 26.0–34.0)
MCHC: 31.9 g/dL (ref 30.0–36.0)
MCV: 97.2 fL (ref 80.0–100.0)
Platelets: 181 10*3/uL (ref 150–400)
RBC: 3.94 MIL/uL (ref 3.87–5.11)
RDW: 13.3 % (ref 11.5–15.5)
WBC: 5.7 10*3/uL (ref 4.0–10.5)
nRBC: 0 % (ref 0.0–0.2)

## 2020-07-02 DIAGNOSIS — I739 Peripheral vascular disease, unspecified: Secondary | ICD-10-CM | POA: Diagnosis not present

## 2020-07-02 DIAGNOSIS — B351 Tinea unguium: Secondary | ICD-10-CM | POA: Diagnosis not present

## 2020-07-02 DIAGNOSIS — M2041 Other hammer toe(s) (acquired), right foot: Secondary | ICD-10-CM | POA: Diagnosis not present

## 2020-07-02 DIAGNOSIS — M2042 Other hammer toe(s) (acquired), left foot: Secondary | ICD-10-CM | POA: Diagnosis not present

## 2020-07-04 ENCOUNTER — Encounter: Payer: Self-pay | Admitting: Adult Health

## 2020-07-04 ENCOUNTER — Non-Acute Institutional Stay (SKILLED_NURSING_FACILITY): Payer: Medicare PPO | Admitting: Adult Health

## 2020-07-04 DIAGNOSIS — F321 Major depressive disorder, single episode, moderate: Secondary | ICD-10-CM | POA: Diagnosis not present

## 2020-07-04 DIAGNOSIS — G301 Alzheimer's disease with late onset: Secondary | ICD-10-CM

## 2020-07-04 DIAGNOSIS — F0280 Dementia in other diseases classified elsewhere without behavioral disturbance: Secondary | ICD-10-CM

## 2020-07-04 DIAGNOSIS — E782 Mixed hyperlipidemia: Secondary | ICD-10-CM | POA: Diagnosis not present

## 2020-07-04 DIAGNOSIS — Z66 Do not resuscitate: Secondary | ICD-10-CM

## 2020-07-04 NOTE — Progress Notes (Signed)
Location:    [facility] Room Number: 142-D Place of Service:  SNF (31)   CODE STATUS: DNR  No Known Allergies  Chief Complaint  Patient presents with  . 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 There are no reports of uncontrolled pain; no reports of anxiety or depressive thought; no reports of agitation.   Past Medical History:  Diagnosis Date  . Dementia without behavioral disturbance (HCC) 04/16/2016   mild with MMSE 24/30 as of 04/16/16  . Diverticulitis   . Hyperlipemia   . Hypertension   . Macular degeneration   . OA (osteoarthritis)     Past Surgical History:  Procedure Laterality Date  . APPENDECTOMY    . TONSILLECTOMY AND ADENOIDECTOMY      Social History   Socioeconomic History  . Marital status: Widowed    Spouse name: Not on file  . Number of children: Not on file  . Years of education: 13 yrs   . Highest education level: Not on file  Occupational History  . Occupation: retired  Tobacco Use  . Smoking status: Never Smoker  . Smokeless tobacco: Never Used  Vaping Use  . Vaping Use: Never used  Substance and Sexual Activity  . Alcohol use: No  . Drug use: No  . Sexual activity: Not Currently  Other Topics Concern  . Not on file  Social History Narrative   Is a long term resident of [facility]    Social Determinants of Health   Financial Resource Strain: Low Risk   . Difficulty of Paying Living Expenses: Not hard at all  Food Insecurity: No Food Insecurity  . Worried About Programme researcher, broadcasting/film/video in the Last Year: Never true  . Ran Out of Food in the Last Year: Never  true  Transportation Needs: No Transportation Needs  . Lack of Transportation (Medical): No  . Lack of Transportation (Non-Medical): No  Physical Activity: Inactive  . Days of Exercise per Week: 0 days  . Minutes of Exercise per Session: 0 min  Stress: No Stress Concern Present  . Feeling of Stress : Not at all  Social Connections: Socially Isolated  . Frequency of Communication with Friends and Family: Once a week  . Frequency of Social Gatherings with Friends and Family: Never  . Attends Religious Services: Never  . Active Member of Clubs or Organizations: No  . Attends Banker Meetings: Never  . Marital Status: Widowed  Intimate Partner Violence: Not At Risk  . Fear of Current or Ex-Partner: No  . Emotionally Abused: No  . Physically Abused: No  . Sexually Abused: No   Family History  Problem Relation Age of Onset  . Cancer Other        family history   . Arthritis Other        family history   . Heart attack Mother 19  . Stroke Father 25  . Macular degeneration Sister   . 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 aspirin EC 325 MG EC tablet Take 1 tablet (325 mg total) by mouth daily.  07/06/2020 [place]-Castor Oil (VENELEX) OINT Apply to sacrum and bilateral buttocks qshift & prn for prevention.  . NON FORMULARY Diet type:  Regular  . Nutritional Supplements (ENSURE ENLIVE PO) Give 120 ml by mouth two times daily between meals   No facility-administered encounter medications on file as of 07/04/2020.     SIGNIFICANT DIAGNOSTIC EXAMS  LABS REVIEWED PREVIOUS:   12-24-19: wbc 7.9; hgb 13.0; hct 41.7; mcv 99.3 plt 221; glucose 106; bun 28; creat 1.40 ;k+ 4.0; na++ 138; ca 9.1 liver normal albumin 3.6    TODAY  06-30-20: wbc 5.7; hgb 12.2; hct 38.3 mcv 97.2 plt 181; glucose 88; bun 19; creat 1.14; k+ 3.7; na++ 140; ca 8.6 liver normal albumin 3.2   Review of Systems  Unable to perform ROS: Dementia (unable to participate )   Physical Exam Constitutional:      General: She is not in acute distress.    Appearance: She is well-developed. She is not diaphoretic.     Comments: thin  Neck:     Thyroid: No thyromegaly.  Cardiovascular:     Rate and Rhythm: Normal rate and regular rhythm.     Pulses: Normal pulses.     Heart sounds: Murmur heard.      Comments: 3/6 Pulmonary:     Effort: Pulmonary effort is normal. No respiratory distress.     Breath sounds: Normal breath sounds.  Abdominal:     General: Bowel sounds are normal. There is no distension.     Palpations: Abdomen is soft.     Tenderness: There is no abdominal tenderness.  Musculoskeletal:        General: Normal range of motion.     Cervical back: Neck supple.     Right lower leg: No edema.     Left lower leg: No edema.  Lymphadenopathy:     Cervical: No cervical adenopathy.  Skin:    General: Skin is warm and dry.  Neurological:     Mental Status: She is alert. Mental status is at baseline.  Psychiatric:        Mood and Affect: Mood normal.      ASSESSMENT/ PLAN:  TODAY  1. Depression major single episode: is stable off zoloft  2. Late onset alzheimer's disease without behavioral disturbance: is without change weight is 124 pounds; will monitor  3. Mixed hyperlipidemia: is stable LDL 153 will monitor   PREVIOUS   4. Hypokalemia: is stable k+ 3.7 will monitor   5. Weight loss non-intentional: weight on 03-28-19: 141 pounds; current weight is 124 pounds will continue supplements as directed weight loss is an unfortunate outcome in the last stages of dementia.   6. Essential hypertension: is stable b/p 107/67 will continue to monitor her status.   7. 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 We will attempt to wean resident as appropriate.  03-15-2005 NP  [place] [phone] Monday through Friday 8am- 5pm  After hours call [phone]

## 2020-07-31 ENCOUNTER — Non-Acute Institutional Stay (SKILLED_NURSING_FACILITY): Payer: Medicare PPO | Admitting: Adult Health

## 2020-07-31 ENCOUNTER — Encounter: Payer: Self-pay | Admitting: Adult Health

## 2020-07-31 DIAGNOSIS — I10 Essential (primary) hypertension: Secondary | ICD-10-CM

## 2020-07-31 DIAGNOSIS — R627 Adult failure to thrive: Secondary | ICD-10-CM

## 2020-07-31 DIAGNOSIS — E876 Hypokalemia: Secondary | ICD-10-CM | POA: Diagnosis not present

## 2020-07-31 NOTE — Progress Notes (Signed)
Location:    [facility] Room Number: 142/D Place of Service:  SNF (31)   CODE STATUS: DNR  No Known Allergies  Chief Complaint  Patient presents with  . Medical Management of Chronic Issues         Hypokalemia:  Failure to thrive in adult:  Essential hypertension    HPI:  She is a 86 year old long term resident of this facility being seen for the management of her chronic illnesses:  Hypokalemia:  Failure to thrive in adult:  Essential hypertension. There are no reports of agitation; no reports of changes in appetite; no reports of uncontrolled pain.    Past Medical History:  Diagnosis Date  . Dementia without behavioral disturbance (HCC) 04/16/2016   mild with MMSE 24/30 as of 04/16/16  . Diverticulitis   . Hyperlipemia   . Hypertension   . Macular degeneration   . OA (osteoarthritis)     Past Surgical History:  Procedure Laterality Date  . APPENDECTOMY    . TONSILLECTOMY AND ADENOIDECTOMY      Social History   Socioeconomic History  . Marital status: Widowed    Spouse name: Not on file  . Number of children: Not on file  . Years of education: 13 yrs   . Highest education level: Not on file  Occupational History  . Occupation: retired  Tobacco Use  . Smoking status: Never Smoker  . Smokeless tobacco: Never Used  Vaping Use  . Vaping Use: Never used  Substance and Sexual Activity  . Alcohol use: No  . Drug use: No  . Sexual activity: Not Currently  Other Topics Concern  . Not on file  Social History Narrative   Is a long term resident of [facility]    Social Determinants of Health   Financial Resource Strain: Low Risk   . Difficulty of Paying Living Expenses: Not hard at all  Food Insecurity: No Food Insecurity  . Worried About Programme researcher, broadcasting/film/video in the Last Year: Never true  . Ran Out of Food in the Last Year: Never true  Transportation Needs: No Transportation Needs  . Lack of Transportation (Medical): No  . Lack of Transportation  (Non-Medical): No  Physical Activity: Inactive  . Days of Exercise per Week: 0 days  . Minutes of Exercise per Session: 0 min  Stress: No Stress Concern Present  . Feeling of Stress : Not at all  Social Connections: Socially Isolated  . Frequency of Communication with Friends and Family: Once a week  . Frequency of Social Gatherings with Friends and Family: Never  . Attends Religious Services: Never  . Active Member of Clubs or Organizations: No  . Attends Banker Meetings: Never  . Marital Status: Widowed  Intimate Partner Violence: Not At Risk  . Fear of Current or Ex-Partner: No  . Emotionally Abused: No  . Physically Abused: No  . Sexually Abused: No   Family History  Problem Relation Age of Onset  . Cancer Other        family history   . Arthritis Other        family history   . Heart attack Mother 85  . Stroke Father 80  . Macular degeneration Sister   . 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 aspirin EC 325 MG EC tablet Take 1 tablet (325 mg total) by mouth daily.  08/02/2020 [place]-Castor Oil (VENELEX) OINT Apply to sacrum and bilateral buttocks qshift & prn for prevention.  . NON FORMULARY Diet type:  Regular  . Nutritional Supplements (ENSURE ENLIVE PO) Give 120 ml by mouth two times daily between meals   No facility-administered encounter medications on file as of 07/31/2020.     SIGNIFICANT DIAGNOSTIC EXAMS  LABS REVIEWED PREVIOUS:   12-24-19: wbc 7.9; hgb 13.0; hct 41.7; mcv 99.3 plt 221; glucose 106; bun 28; creat 1.40 ;k+ 4.0; na++ 138; ca 9.1 liver normal albumin 3.6  06-30-20: wbc 5.7; hgb 12.2; hct 38.3 mcv 97.2 plt 181; glucose 88; bun 19; creat 1.14; k+ 3.7; na++ 140; ca 8.6 liver normal albumin  3.2   NO NEW LABS.   Review of Systems  Unable to perform ROS: Dementia (unable to participate )    Physical Exam Constitutional:      General: She is not in acute distress.    Appearance: She is not diaphoretic.     Comments: thin  Neck:     Thyroid: No thyromegaly.  Cardiovascular:     Rate and Rhythm: Normal rate and regular rhythm.     Pulses: Normal pulses.     Heart sounds: Murmur heard.      Comments: 3/6 Pulmonary:     Effort: Pulmonary effort is normal. No respiratory distress.     Breath sounds: Normal breath sounds.  Abdominal:     General: Bowel sounds are normal. There is no distension.     Palpations: Abdomen is soft.     Tenderness: There is no abdominal tenderness.  Musculoskeletal:        General: Normal range of motion.     Cervical back: Neck supple.     Right lower leg: No edema.     Left lower leg: No edema.  Lymphadenopathy:     Cervical: No cervical adenopathy.  Skin:    General: Skin is warm and dry.  Neurological:     Mental Status: She is alert. Mental status is at baseline.  Psychiatric:        Mood and Affect: Mood normal.     ASSESSMENT/ PLAN:  TODAY  1. Hypokalemia: is stable k+ 3.7 will monitor   2. Failure to thrive in adult: is without change albumin 3.2.  Current weight is 124 pounds will continue supplements as directed; unfortunately weight loss is an expected outcome at the late stages of dementia.   3. Essential hypertension: is stable b/p 108/56 will monitor   PREVIOUS   4. Cerebrovascular accident (CVA) due to bilateral embolism of middle cerebral arteries: is stable will continue asa 325 mg daily   5. Depression major single episode: is stable off zoloft  6. Late onset alzheimer's disease without behavioral disturbance: is without change weight is 124 pounds; will monitor  7. Mixed hyperlipidemia: is stable LDL 153 will monitor     MD is aware of resident's narcotic use and is in agreement with current plan of  care. We will attempt to wean resident as appropriate.  07-02-20 NP [facility]  Contact [phone] Monday through Friday 8am- 5pm  After hours call [phone]

## 2020-08-01 ENCOUNTER — Non-Acute Institutional Stay (INDEPENDENT_AMBULATORY_CARE_PROVIDER_SITE_OTHER): Payer: Medicare PPO | Admitting: Adult Health

## 2020-08-01 ENCOUNTER — Encounter: Payer: Self-pay | Admitting: Adult Health

## 2020-08-01 DIAGNOSIS — Z0000 Encounter for general adult medical examination without abnormal findings: Secondary | ICD-10-CM | POA: Diagnosis not present

## 2020-08-01 DIAGNOSIS — R627 Adult failure to thrive: Secondary | ICD-10-CM | POA: Insufficient documentation

## 2020-08-01 NOTE — Progress Notes (Signed)
Subjective:   Lynn Mcintosh is a 86 y.o. female who presents for Medicare Annual (Subsequent) preventive examination.  Review of Systems    Review of Systems  Unable to perform ROS: Dementia (unable to participate )   Cardiac Risk Factors include: advanced age (>1255men, 26>65 women);sedentary lifestyle     Objective:    Today's Vitals   08/01/20 0920 08/01/20 1147  BP: (!) 108/56   Pulse: 81   Resp: 18   Temp: 97.8 F (36.6 C)   SpO2: 99%   Weight: 124 lb (56.2 kg)   Height: 5\' 5"  (1.651 m)   PainSc:  0-No pain   Body mass index is 20.63 kg/m.  Advanced Directives 08/01/2020 07/31/2020 06/26/2020 05/28/2020 04/23/2020 03/27/2020 03/21/2020  Does Patient Have a Medical Advance Directive? Yes Yes Yes Yes Yes Yes Yes  Type of Estate agentAdvance Directive Healthcare Power of MoraAttorney;Out of facility DNR (pink MOST or yellow form) Out of facility DNR (pink MOST or yellow form) Out of facility DNR (pink MOST or yellow form) Out of facility DNR (pink MOST or yellow form) Out of facility DNR (pink MOST or yellow form) Out of facility DNR (pink MOST or yellow form) Out of facility DNR (pink MOST or yellow form)  Does patient want to make changes to medical advance directive? No - Patient declined No - Patient declined No - Patient declined No - Patient declined No - Patient declined No - Patient declined No - Patient declined  Copy of Healthcare Power of Attorney in Chart? Yes - validated most recent copy scanned in chart (See row information) - - - - - -  Pre-existing out of facility DNR order (yellow form or pink MOST form) - Yellow form placed in chart (order not valid for inpatient use) Yellow form placed in chart (order not valid for inpatient use) Yellow form placed in chart (order not valid for inpatient use) Yellow form placed in chart (order not valid for inpatient use) Yellow form placed in chart (order not valid for inpatient use) Yellow form placed in chart (order not valid for inpatient use)     Current Medications (verified) Outpatient Encounter Medications as of 08/01/2020  Medication Sig   aspirin EC 325 MG EC tablet Take 1 tablet (325 mg total) by mouth daily.   Balsam [place]-Castor Oil (VENELEX) OINT Apply to sacrum and bilateral buttocks qshift & prn for prevention.   NON FORMULARY Diet type:  Regular   Nutritional Supplements (ENSURE ENLIVE PO) Give 120 ml by mouth two times daily between meals   No facility-administered encounter medications on file as of 08/01/2020.    Allergies (verified) Patient has no known allergies.   History: Past Medical History:  Diagnosis Date   Dementia without behavioral disturbance (HCC) 04/16/2016   mild with MMSE 24/30 as of 04/16/16   Diverticulitis    Hyperlipemia    Hypertension    Macular degeneration    OA (osteoarthritis)    Past Surgical History:  Procedure Laterality Date   APPENDECTOMY     TONSILLECTOMY AND ADENOIDECTOMY     Family History  Problem Relation Age of Onset   Cancer Other        family history    Arthritis Other        family history    Heart attack Mother [id number]   Stroke Father [id number]   Macular degeneration Sister    Colon cancer Son        Recently diagnosed with Stage IV Gist  tumor   Social History   Socioeconomic History   Marital status: Widowed    Spouse name: Not on file   Number of children: Not on file   Years of education: 13 yrs    Highest education level: Not on file  Occupational History   Occupation: retired  Tobacco Use   Smoking status: Never Smoker   Smokeless tobacco: Never Used  Building services engineer Use: Never used  Substance and Sexual Activity   Alcohol use: No   Drug use: No   Sexual activity: Not Currently  Other Topics Concern   Not on file  Social History Narrative   Is a long term resident of [facility]    Social Determinants of Health   Financial Resource Strain:    Difficulty of Paying Living Expenses: Not on file  Food Insecurity:     Worried About Programme researcher, broadcasting/film/video in the Last Year: Not on file   [employer] of Food in the Last Year: Not on file  Transportation Needs:    Lack of Transportation (Medical): Not on file   Lack of Transportation (Non-Medical): Not on file  Physical Activity:    Days of Exercise per Week: Not on file   Minutes of Exercise per Session: Not on file  Stress:    Feeling of Stress : Not on file  Social Connections:    Frequency of Communication with Friends and Family: Not on file   Frequency of Social Gatherings with Friends and Family: Not on file   Attends Religious Services: Not on file   Active Member of Clubs or Organizations: Not on file   Attends Banker Meetings: Not on file   Marital Status: Not on file    Tobacco Counseling Counseling given: Not Answered   Clinical Intake:  Pre-visit preparation completed: Yes  Pain : No/denies pain Pain Score: 0-No pain     BMI - recorded: 20.63 Nutritional Status: BMI of 19-24  Normal Diabetes: No  How often do you need to have someone help you when you read instructions, pamphlets, or other written materials from your doctor or pharmacy?: 5 - Always  Diabetic? No  Interpreter Needed?: No      Activities of Daily Living In your present state of health, do you have any difficulty performing the following activities: 08/01/2020  Hearing? Y  Vision? N  Difficulty concentrating or making decisions? Y  Walking or climbing stairs? Y  Dressing or bathing? Y  Doing errands, shopping? Y  Preparing Food and eating ? Y  Using the Toilet? Y  In the past six months, have you accidently leaked urine? Y  Do you have problems with loss of bowel control? Y  Managing your Medications? Y  Managing your Finances? Y  Housekeeping or managing your Housekeeping? Y  Some recent data might be hidden    Patient Care Team: Sharee Holster, NP as PCP - General (Geriatric Medicine) [facility]  Indicate any recent Medical Services you may have received from other than [facility] providers in the past year (date may be approximate).     Assessment:   This is a routine wellness examination for Anisten.  Hearing/Vision screen No exam data present  Dietary issues and exercise activities discussed: Current Exercise Habits: The patient does not participate in regular exercise at present  Goals     Follow up with Provider as scheduled     General - Client will not  be readmitted within 30 days (C-SNP)      Depression Screen PHQ 2/9 Scores 08/01/2020 08/01/2019 07/02/2016 04/16/2016  PHQ - 2 Score - 0 0 0  Exception Documentation Other- indicate reason in comment box - - -  Not completed unable to participate - - -    Fall Risk Fall Risk  08/01/2020 08/01/2019 02/11/2017 07/02/2016 04/16/2016  Falls in the past year? 0 1 No No No  Number falls in past yr: 0 1 - - -  Injury with Fall? 0 0 - - -  Risk for fall due to : Impaired balance/gait History of fall(s);Impaired mobility - - -  Follow up Falls evaluation completed Falls evaluation completed - - -    Any stairs in or around the home? No  If so, are there any without handrails? N/a  Home free of loose throw rugs in walkways, pet beds, electrical cords, etc? yes  Adequate lighting in your home to reduce risk of falls? Yes    ASSISTIVE DEVICES UTILIZED TO PREVENT FALLS:  Life alert? Yes  Use of a cane, walker or w/c? Wheelchair  Grab bars in the bathroom? Yes  Shower chair or bench in shower? Yes  Elevated toilet seat or a handicapped toilet? Yes   TIMED UP AND GO:  Was the test performed? nonambulatory   Cognitive Function: MMSE - Mini Mental State Exam 08/01/2020 02/11/2017 04/16/2016  Not completed: Unable to complete - -  Orientation to time - 4 2  Orientation to Place - 3 5  Registration - 3 3  Attention/ Calculation - 5 5  Recall - 0 3  Language- name 2 objects - 2 2  Language- repeat - 1 1   Language- follow 3 step command - 3 1  Language- read & follow direction - 1 1  Write a sentence - 1 1  Copy design - 1 0  Total score - 24 24        Immunizations Immunization History  Administered Date(s) Administered   Influenza-Unspecified 06/18/2019, 06/20/2020   Moderna SARS-COVID-2 Vaccination 09/19/2019, 10/17/2019, 07/17/2020   Pneumococcal Conjugate-13 06/05/2019   Pneumococcal Polysaccharide-23 02/11/2017   Tdap 06/05/2019     Qualifies for Shingles Vaccine? N/a   Screening Tests Health Maintenance  Topic Date Due   TETANUS/TDAP  06/04/2029   INFLUENZA VACCINE  Completed   COVID-19 Vaccine  Completed   PNA vac Low Risk Adult  Completed   DEXA SCAN  Discontinued    Health Maintenance  There are no preventive care reminders to display for this patient.  Lung Cancer Screening: (Low Dose CT Chest recommended if Age 80-80 years, 30 pack-year currently smoking OR have quit w/in 15years.) does not  qualify.   Lung Cancer Screening Referral: n/a   Additional Screening:  Hepatitis C Screening:n/a   Vision Screening: Recommended annual ophthalmology exams for early detection of glaucoma and other disorders of the eye. Is the patient up to date with their annual eye exam? yes  Who is the provider or what is the name of the office in which the patient attends annual eye exams? Per facility  If pt is not established with a provider, would they like to be referred to a provider to establish care?   Dental Screening: Recommended annual dental exams for proper oral hygiene  Community Resource Referral / Chronic Care Management:  CRR required this visit?  no   CCM required this visit?  Yes     Plan:     I  have personally reviewed and noted the following in the patients chart:    Medical and social history  Use of alcohol, tobacco or illicit drugs   Current medications and supplements  Functional ability and status  Nutritional  status  Physical activity  Advanced directives  List of other physicians  Hospitalizations, surgeries, and ER visits in previous 12 months  Vitals  Screenings to include cognitive, depression, and falls  Referrals and appointments  In addition, I have reviewed and discussed with patient certain preventive protocols, quality metrics, and best practice recommendations. A written personalized care plan for preventive services as well as general preventive health recommendations were provided to patient.     Sharee Holster, NP   08/01/2020

## 2020-08-01 NOTE — Patient Instructions (Signed)
°  Lynn Mcintosh , Thank you for taking time to come for your Medicare Wellness Visit. I appreciate your ongoing commitment to your health goals. Please review the following plan we discussed and let me know if I can assist you in the future.   These are the goals we discussed: Goals     Follow up with Provider as scheduled     General - Client will not be readmitted within 30 days (C-SNP)       This is a list of the screening recommended for you and due dates:  Health Maintenance  Topic Date Due   Tetanus Vaccine  06/04/2029   Flu Shot  Completed   COVID-19 Vaccine  Completed   Pneumonia vaccines  Completed   DEXA scan (bone density measurement)  Discontinued

## 2020-08-12 DIAGNOSIS — Z1159 Encounter for screening for other viral diseases: Secondary | ICD-10-CM | POA: Diagnosis not present

## 2020-08-12 DIAGNOSIS — I639 Cerebral infarction, unspecified: Secondary | ICD-10-CM | POA: Diagnosis not present

## 2020-09-01 ENCOUNTER — Encounter: Payer: Self-pay | Admitting: Adult Health

## 2020-09-01 ENCOUNTER — Non-Acute Institutional Stay (SKILLED_NURSING_FACILITY): Payer: Medicare PPO | Admitting: Adult Health

## 2020-09-01 DIAGNOSIS — I63413 Cerebral infarction due to embolism of bilateral middle cerebral arteries: Secondary | ICD-10-CM

## 2020-09-01 DIAGNOSIS — F0280 Dementia in other diseases classified elsewhere without behavioral disturbance: Secondary | ICD-10-CM

## 2020-09-01 DIAGNOSIS — G301 Alzheimer's disease with late onset: Secondary | ICD-10-CM

## 2020-09-01 DIAGNOSIS — F321 Major depressive disorder, single episode, moderate: Secondary | ICD-10-CM

## 2020-09-01 NOTE — Progress Notes (Signed)
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 There are no reports of uncontrolled pain; she is resistant to nursing care at times. There are no reports of insomnia; no reports of anxiety.   Past Medical History:  Diagnosis Date   Dementia without behavioral disturbance (HCC) 04/16/2016   mild with MMSE 24/30 as of 04/16/16   Diverticulitis    Hyperlipemia    Hypertension    Macular degeneration    OA (osteoarthritis)     Past Surgical History:  Procedure Laterality Date   APPENDECTOMY     TONSILLECTOMY AND ADENOIDECTOMY      Social History   Socioeconomic History   Marital status: Widowed    Spouse name: Not on file   Number of children: Not on file   Years of education: 13 yrs    Highest education level: Not on file  Occupational History   Occupation: retired  Tobacco Use   Smoking status: Never Smoker   Smokeless tobacco: Never Used  Building services engineer Use: Never used  Substance and Sexual Activity   Alcohol use: No   Drug use: No   Sexual activity: Not Currently  Other Topics Concern   Not on file  Social History Narrative   Is a long term resident of [facility]    Social Determinants of Health   Financial Resource Strain: Not on file  Food Insecurity: Not on file  Transportation Needs: Not  on file  Physical Activity: Not on file  Stress: Not on file  Social Connections: Not on file  Intimate Partner Violence: Not on file   Family History  Problem Relation Age of Onset   Cancer Other        family history    Arthritis Other        family history    Heart attack Mother 86   Stroke Father 95   Macular degeneration Sister    Colon cancer Son        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   Balsam [place]-Castor Oil (VENELEX) OINT Apply to sacrum and bilateral buttocks qshift & prn for prevention.   NON FORMULARY Diet type:  Regular   Nutritional Supplements (ENSURE ENLIVE PO) Give 120 ml by mouth two times daily between meals   senna-docusate (SENOKOT-S) 8.6-50 MG tablet Take 2 tablets by mouth 2 (two) times daily.  No facility-administered encounter medications on file as of 09/01/2020.     SIGNIFICANT DIAGNOSTIC EXAMS   LABS REVIEWED PREVIOUS:   12-24-19: wbc 7.9; hgb 13.0; hct 41.7; mcv 99.3 plt 221; glucose 106; bun 28; creat 1.40 ;k+ 4.0; na++ 138; ca 9.1 liver normal albumin 3.6  06-30-20: wbc 5.7; hgb 12.2; hct 38.3 mcv 97.2 plt 181; glucose 88; bun 19; creat 1.14; k+ 3.7; na++ 140; ca 8.6 liver normal albumin 3.2   NO NEW LABS.   Review of Systems  Unable to perform ROS: Dementia (unable to participate )   Physical Exam Constitutional:      General: She is not in acute distress.    Appearance: She is well-developed and well-nourished. She is not diaphoretic.     Comments: thin  Neck:     Thyroid: No thyromegaly.  Cardiovascular:     Rate and Rhythm: Normal rate and regular rhythm.     Pulses: Normal pulses and intact distal pulses.      Heart sounds: Murmur heard.      Comments: 3/6 Pulmonary:     Effort: Pulmonary effort is normal. No respiratory distress.     Breath sounds: Normal breath sounds.  Abdominal:     General: Bowel sounds are normal. There is no distension.     Palpations: Abdomen is soft.     Tenderness: There is no abdominal tenderness.  Musculoskeletal:        General: No edema. Normal range of motion.     Cervical back: Neck supple.     Right lower leg: No edema.     Left lower leg: No edema.  Lymphadenopathy:     Cervical: No cervical adenopathy.  Skin:    General: Skin is warm and dry.  Neurological:     Mental Status: She is alert. Mental status is at baseline.  Psychiatric:        Mood and Affect: Mood and affect and mood normal.     ASSESSMENT/ PLAN:  TODAY  1. 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 Depression major single episode is stable off zoloft  3. Late onset alzheimer's disease without behavioral disturbance: is without significant change is losing weight current weight is 120 pounds will monitor   PREVIOUS   4. Mixed hyperlipidemia: is stable LDL 153 will monitor  5. Hypokalemia: is stable k+ 3.7 will monitor   6. Failure to thrive in adult: is without change albumin 3.2.  Current weight is 120 pounds will continue supplements as directed; unfortunately weight loss is an expected outcome at the late stages of dementia.   7. Essential hypertension: is stable b/p 109/76 will monitor     MD is aware of resident's narcotic use and is in agreement with current plan of care. We will attempt to wean resident as appropriate.  Synthia Innocent NP [facility]  Contact [phone] Monday through Friday 8am- 5pm  After hours call [phone]

## 2020-09-10 DIAGNOSIS — Z1159 Encounter for screening for other viral diseases: Secondary | ICD-10-CM | POA: Diagnosis not present

## 2020-09-10 DIAGNOSIS — I639 Cerebral infarction, unspecified: Secondary | ICD-10-CM | POA: Diagnosis not present

## 2020-09-15 DIAGNOSIS — Z1159 Encounter for screening for other viral diseases: Secondary | ICD-10-CM | POA: Diagnosis not present

## 2020-09-15 DIAGNOSIS — I639 Cerebral infarction, unspecified: Secondary | ICD-10-CM | POA: Diagnosis not present

## 2020-09-24 DIAGNOSIS — I639 Cerebral infarction, unspecified: Secondary | ICD-10-CM | POA: Diagnosis not present

## 2020-09-24 DIAGNOSIS — Z1159 Encounter for screening for other viral diseases: Secondary | ICD-10-CM | POA: Diagnosis not present

## 2020-09-25 ENCOUNTER — Non-Acute Institutional Stay (SKILLED_NURSING_FACILITY): Payer: Medicare PPO | Admitting: Adult Health

## 2020-09-25 ENCOUNTER — Encounter: Payer: Self-pay | Admitting: Adult Health

## 2020-09-25 DIAGNOSIS — G301 Alzheimer's disease with late onset: Secondary | ICD-10-CM | POA: Diagnosis not present

## 2020-09-25 DIAGNOSIS — I63413 Cerebral infarction due to embolism of bilateral middle cerebral arteries: Secondary | ICD-10-CM | POA: Diagnosis not present

## 2020-09-25 DIAGNOSIS — F321 Major depressive disorder, single episode, moderate: Secondary | ICD-10-CM

## 2020-09-25 DIAGNOSIS — F0280 Dementia in other diseases classified elsewhere without behavioral disturbance: Secondary | ICD-10-CM

## 2020-09-25 NOTE — Progress Notes (Signed)
Location:  [facility] Room Number: 142/D Place of Service:  SNF (31)   CODE STATUS: DNR  No Known Allergies  Chief Complaint  Patient presents with  . Acute Visit    Care Plan Meeting     HPI:  We have come together for her care plan meeting. BIMS 5/15 mood 3/30. She requires extensive assist with her adls. She does feed herself. She is nonambulatory. She is frequently incontinent of bladder and bowel. There are have seen no recent falls. Her weight is 120 pounds she has lost 13% of her body weight over the past year. There are no reports of uncontrolled pain; no reports of agitation; no reports of insomnia. She continues to be followed for her chronic illnesses including: Late onset alzheimer's disease without behavioral disturbance  Cerebral vascular accident due to bilateral embolism of middle cerebral arteries  Depression major single episode moderate  Past Medical History:  Diagnosis Date  . Dementia without behavioral disturbance (HCC) 04/16/2016   mild with MMSE 24/30 as of 04/16/16  . Diverticulitis   . Hyperlipemia   . Hypertension   . Macular degeneration   . OA (osteoarthritis)     Past Surgical History:  Procedure Laterality Date  . APPENDECTOMY    . TONSILLECTOMY AND ADENOIDECTOMY      Social History   Socioeconomic History  . Marital status: Widowed    Spouse name: Not on file  . Number of children: Not on file  . Years of education: 13 yrs   . Highest education level: Not on file  Occupational History  . Occupation: retired  Tobacco Use  . Smoking status: Never Smoker  . Smokeless tobacco: Never Used  Vaping Use  . Vaping Use: Never used  Substance and Sexual Activity  . Alcohol use: No  . Drug use: No  . Sexual activity: Not Currently  Other Topics Concern  . Not on file  Social History Narrative   Is a long term resident of [facility]    Social Determinants of Health   Financial Resource Strain: Not on file  Food  Insecurity: Not on file  Transportation Needs: Not on file  Physical Activity: Not on file  Stress: Not on file  Social Connections: Not on file  Intimate Partner Violence: Not on file   Family History  Problem Relation Age of Onset  . Cancer Other        family history   . Arthritis Other        family history   . Heart attack Mother 5  . Stroke Father 30  . Macular degeneration Sister   . 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 aspirin EC 325 MG EC tablet Take 1 tablet (325 mg total) by mouth daily.  09/27/2020 [place]-Castor Oil (VENELEX) OINT Apply to sacrum and bilateral buttocks qshift & prn for prevention.  . NON FORMULARY Diet type:  Regular  . Nutritional Supplements (ENSURE ENLIVE PO) Give 120 ml by mouth two times daily between meals  . senna-docusate (SENOKOT-S) 8.6-50 MG tablet Take 2 tablets by mouth 2 (two) times daily.  No facility-administered encounter medications on file as of 09/25/2020.     SIGNIFICANT DIAGNOSTIC EXAMS  LABS REVIEWED PREVIOUS:   12-24-19: wbc 7.9; hgb 13.0; hct 41.7; mcv 99.3 plt 221; glucose 106; bun 28; creat 1.40 ;k+ 4.0; na++ 138; ca 9.1 liver normal albumin 3.6  06-30-20: wbc 5.7; hgb 12.2; hct 38.3 mcv 97.2 plt 181; glucose 88; bun 19; creat 1.14; k+ 3.7; na++ 140; ca 8.6 liver normal albumin 3.2   NO NEW LABS.   Review of Systems  Unable to perform ROS: Dementia (unable to participate )   Physical Exam Constitutional:      General: She is not in acute distress.    Appearance: She is underweight and well-nourished. She is not diaphoretic.  Neck:     Thyroid: No thyromegaly.  Cardiovascular:     Rate and Rhythm: Normal rate and regular rhythm.     Pulses: Normal pulses and intact distal  pulses.     Heart sounds: Normal heart sounds.     Comments: 3/6 Pulmonary:     Effort: Pulmonary effort is normal. No respiratory distress.     Breath sounds: Normal breath sounds.  Abdominal:     General: Bowel sounds are normal. There is no distension.     Palpations: Abdomen is soft.     Tenderness: There is no abdominal tenderness.  Musculoskeletal:        General: No edema. Normal range of motion.     Cervical back: Neck supple.     Right lower leg: No edema.     Left lower leg: No edema.  Lymphadenopathy:     Cervical: No cervical adenopathy.  Skin:    General: Skin is warm and dry.  Neurological:     Mental Status: She is alert. Mental status is at baseline.  Psychiatric:        Mood and Affect: Mood and affect and mood normal.       ASSESSMENT/ PLAN:  TODAY  1. Late onset alzheimer's disease without behavioral disturbance 2. Cerebral vascular accident due to bilateral embolism of middle cerebral arteries 3. Depression major single episode moderate  Will continue current medications Will continue plan of care Will continue to monitor her status.   MD is aware of resident's narcotic use and is in agreement with current plan of care. We will attempt to wean resident as appropriate.  Synthia Innocent NP [facility]  Contact [phone] Monday through Friday 8am- 5pm  After hours call [phone]

## 2020-09-30 DIAGNOSIS — L602 Onychogryphosis: Secondary | ICD-10-CM | POA: Diagnosis not present

## 2020-09-30 DIAGNOSIS — I739 Peripheral vascular disease, unspecified: Secondary | ICD-10-CM | POA: Diagnosis not present

## 2020-10-03 ENCOUNTER — Non-Acute Institutional Stay (SKILLED_NURSING_FACILITY): Payer: Medicare PPO | Admitting: Adult Health

## 2020-10-03 ENCOUNTER — Encounter: Payer: Self-pay | Admitting: Adult Health

## 2020-10-03 DIAGNOSIS — R627 Adult failure to thrive: Secondary | ICD-10-CM

## 2020-10-03 DIAGNOSIS — Z1159 Encounter for screening for other viral diseases: Secondary | ICD-10-CM | POA: Diagnosis not present

## 2020-10-03 DIAGNOSIS — E782 Mixed hyperlipidemia: Secondary | ICD-10-CM | POA: Diagnosis not present

## 2020-10-03 DIAGNOSIS — E876 Hypokalemia: Secondary | ICD-10-CM

## 2020-10-03 DIAGNOSIS — I639 Cerebral infarction, unspecified: Secondary | ICD-10-CM | POA: Diagnosis not present

## 2020-10-03 NOTE — Progress Notes (Signed)
Location:  [facility] Room Number: 142/D Place of Service:  SNF (31)   CODE STATUS: DNR  No Known Allergies  Chief Complaint  Patient presents with  . Medical Management of Chronic Issues            mixed hyperlipidemia   Hypokalemia  Failure to thrive in adult      HPI:  She is a 87 year old long term resident of this facility being seen for the management of her chronic illnesses: mixed hyperlipidemia   Hypokalemia  Failure to thrive in adult . There are no reports of uncontrolled pain. She will at time become resistant to nursing care. No reports of constipation or diarrhea.   Past Medical History:  Diagnosis Date  . Dementia without behavioral disturbance (HCC) 04/16/2016   mild with MMSE 24/30 as of 04/16/16  . Diverticulitis   . Hyperlipemia   . Hypertension   . Macular degeneration   . OA (osteoarthritis)     Past Surgical History:  Procedure Laterality Date  . APPENDECTOMY    . TONSILLECTOMY AND ADENOIDECTOMY      Social History   Socioeconomic History  . Marital status: Widowed    Spouse name: Not on file  . Number of children: Not on file  . Years of education: 13 yrs   . Highest education level: Not on file  Occupational History  . Occupation: retired  Tobacco Use  . Smoking status: Never Smoker  . Smokeless tobacco: Never Used  Vaping Use  . Vaping Use: Never used  Substance and Sexual Activity  . Alcohol use: No  . Drug use: No  . Sexual activity: Not Currently  Other Topics Concern  . Not on file  Social History Narrative   Is a long term resident of [facility]    Social Determinants of Health   Financial Resource Strain: Not on file  Food Insecurity: Not on file  Transportation Needs: Not on file  Physical Activity: Not on file  Stress: Not on file  Social Connections: Not on file  Intimate Partner Violence: Not on file   Family History  Problem Relation Age of Onset  . Cancer Other        family history   .  Arthritis Other        family history   . Heart attack Mother 94  . Stroke Father 15  . Macular degeneration Sister   . 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 aspirin EC 325 MG EC tablet Take 1 tablet (325 mg total) by mouth daily.  10/05/2020 [place]-Castor Oil (VENELEX) OINT Apply to sacrum and bilateral buttocks qshift & prn for prevention.  . NON FORMULARY Diet type:  Regular  . Nutritional Supplements (ENSURE ENLIVE PO) Give 120 ml by mouth two times daily between meals  . senna-docusate (SENOKOT-S) 8.6-50 MG tablet Take 2 tablets by mouth 2 (two) times daily.   No facility-administered encounter medications on file as of 10/03/2020.     SIGNIFICANT DIAGNOSTIC EXAMS   LABS REVIEWED PREVIOUS:   12-24-19: wbc 7.9; hgb 13.0; hct 41.7; mcv 99.3  plt 221; glucose 106; bun 28; creat 1.40 ;k+ 4.0; na++ 138; ca 9.1 liver normal albumin 3.6  06-30-20: wbc 5.7; hgb 12.2; hct 38.3 mcv 97.2 plt 181; glucose 88; bun 19; creat 1.14; k+ 3.7; na++ 140; ca 8.6 liver normal albumin 3.2   NO NEW LABS.   Review of Systems  Unable to perform ROS: Dementia (unable to participate )   Physical Exam Constitutional:      General: She is not in acute distress.    Appearance: She is underweight and well-nourished. She is not diaphoretic.  Neck:     Thyroid: No thyromegaly.  Cardiovascular:     Rate and Rhythm: Normal rate and regular rhythm.     Pulses: Normal pulses and intact distal pulses.     Heart sounds: Normal heart sounds.     Comments: 3/6 Pulmonary:     Effort: Pulmonary effort is normal. No respiratory distress.     Breath sounds: Normal breath sounds.  Abdominal:     General: Bowel sounds are normal. There is no distension.     Palpations: Abdomen is  soft.     Tenderness: There is no abdominal tenderness.  Musculoskeletal:        General: No edema. Normal range of motion.     Cervical back: Neck supple.     Right lower leg: No edema.     Left lower leg: No edema.  Lymphadenopathy:     Cervical: No cervical adenopathy.  Skin:    General: Skin is warm and dry.  Neurological:     Mental Status: She is alert. Mental status is at baseline.  Psychiatric:        Mood and Affect: Mood and affect and mood normal.       ASSESSMENT/ PLAN:  TODAY  1. mixed hyperlipidemia  Is stable LDL 153 will monitor   2. Hypokalemia: is stable k+ 3.7 will monitor   3. Failure to thrive in adult is without change albumin 3.2 current weight is 116 pounds; will continue supplements as ordered; unfortunately weight loss is an unfortunate outcome in the the late stages of dementia.   PREVIOUS   4. Essential hypertension: is stable b/p 109/76 will monitor   5. Cerebrovascular accident (CVA) due to bilateral embolism of middle cerebral arteries: is stable will continue asa 325 mg daily   6. Depression major single episode is stable off zoloft  7. Late onset alzheimer's disease without behavioral disturbance: is without significant change is losing weight current weight is 116 pounds will monitor    MD is aware of resident's narcotic use and is in agreement with current plan of care. We will attempt to wean resident as appropriate.  Synthia Innocent NP [facility]  Contact [phone] Monday through Friday 8am- 5pm  After hours call [phone]

## 2020-10-06 DIAGNOSIS — Z1159 Encounter for screening for other viral diseases: Secondary | ICD-10-CM | POA: Diagnosis not present

## 2020-10-06 DIAGNOSIS — I639 Cerebral infarction, unspecified: Secondary | ICD-10-CM | POA: Diagnosis not present

## 2020-10-08 DIAGNOSIS — I639 Cerebral infarction, unspecified: Secondary | ICD-10-CM | POA: Diagnosis not present

## 2020-10-08 DIAGNOSIS — Z1159 Encounter for screening for other viral diseases: Secondary | ICD-10-CM | POA: Diagnosis not present

## 2020-10-10 DIAGNOSIS — Z1159 Encounter for screening for other viral diseases: Secondary | ICD-10-CM | POA: Diagnosis not present

## 2020-10-10 DIAGNOSIS — I639 Cerebral infarction, unspecified: Secondary | ICD-10-CM | POA: Diagnosis not present

## 2020-10-13 DIAGNOSIS — Z1159 Encounter for screening for other viral diseases: Secondary | ICD-10-CM | POA: Diagnosis not present

## 2020-10-13 DIAGNOSIS — I639 Cerebral infarction, unspecified: Secondary | ICD-10-CM | POA: Diagnosis not present

## 2020-10-15 DIAGNOSIS — I639 Cerebral infarction, unspecified: Secondary | ICD-10-CM | POA: Diagnosis not present

## 2020-10-15 DIAGNOSIS — Z1159 Encounter for screening for other viral diseases: Secondary | ICD-10-CM | POA: Diagnosis not present

## 2020-10-17 DIAGNOSIS — Z1159 Encounter for screening for other viral diseases: Secondary | ICD-10-CM | POA: Diagnosis not present

## 2020-10-17 DIAGNOSIS — I639 Cerebral infarction, unspecified: Secondary | ICD-10-CM | POA: Diagnosis not present

## 2020-10-20 DIAGNOSIS — Z1159 Encounter for screening for other viral diseases: Secondary | ICD-10-CM | POA: Diagnosis not present

## 2020-10-20 DIAGNOSIS — I639 Cerebral infarction, unspecified: Secondary | ICD-10-CM | POA: Diagnosis not present

## 2020-10-22 DIAGNOSIS — Z1159 Encounter for screening for other viral diseases: Secondary | ICD-10-CM | POA: Diagnosis not present

## 2020-10-22 DIAGNOSIS — I639 Cerebral infarction, unspecified: Secondary | ICD-10-CM | POA: Diagnosis not present

## 2020-10-24 DIAGNOSIS — Z1159 Encounter for screening for other viral diseases: Secondary | ICD-10-CM | POA: Diagnosis not present

## 2020-10-24 DIAGNOSIS — I639 Cerebral infarction, unspecified: Secondary | ICD-10-CM | POA: Diagnosis not present

## 2020-10-27 DIAGNOSIS — I639 Cerebral infarction, unspecified: Secondary | ICD-10-CM | POA: Diagnosis not present

## 2020-10-27 DIAGNOSIS — Z1159 Encounter for screening for other viral diseases: Secondary | ICD-10-CM | POA: Diagnosis not present

## 2020-10-29 DIAGNOSIS — I639 Cerebral infarction, unspecified: Secondary | ICD-10-CM | POA: Diagnosis not present

## 2020-10-29 DIAGNOSIS — Z1159 Encounter for screening for other viral diseases: Secondary | ICD-10-CM | POA: Diagnosis not present

## 2020-11-05 ENCOUNTER — Encounter: Payer: Self-pay | Admitting: Adult Health

## 2020-11-05 ENCOUNTER — Non-Acute Institutional Stay (SKILLED_NURSING_FACILITY): Payer: Medicare PPO | Admitting: Adult Health

## 2020-11-05 DIAGNOSIS — F321 Major depressive disorder, single episode, moderate: Secondary | ICD-10-CM | POA: Diagnosis not present

## 2020-11-05 DIAGNOSIS — I10 Essential (primary) hypertension: Secondary | ICD-10-CM | POA: Diagnosis not present

## 2020-11-05 DIAGNOSIS — I63413 Cerebral infarction due to embolism of bilateral middle cerebral arteries: Secondary | ICD-10-CM

## 2020-11-05 NOTE — Progress Notes (Signed)
Location:  [facility] Room Number: 142/D Place of Service:  SNF (31)   CODE STATUS: DNR  No Known Allergies  Chief Complaint  Patient presents with  . Medical Management of Chronic Issues         Essential hypertension:   Cerebrovascular accident (CVA) due to bilateral embolism of middle cerebral arteries:   Depression major single episode:     HPI:  She is a 87 year old long term resident of this facility being seen for the management of her chronic illnesses: Essential hypertension:   Cerebrovascular accident (CVA) due to bilateral embolism of middle cerebral arteries:   Depression major single episode.  She does spend most of her time in bed per her choice. There are no reports of agitation; she will resist care at times. There are no reports of uncontrolled pain. She does slowly lose weight.   Past Medical History:  Diagnosis Date  . Dementia without behavioral disturbance (HCC) 04/16/2016   mild with MMSE 24/30 as of 04/16/16  . Diverticulitis   . Hyperlipemia   . Hypertension   . Macular degeneration   . OA (osteoarthritis)     Past Surgical History:  Procedure Laterality Date  . APPENDECTOMY    . TONSILLECTOMY AND ADENOIDECTOMY      Social History   Socioeconomic History  . Marital status: Widowed    Spouse name: Not on file  . Number of children: Not on file  . Years of education: 13 yrs   . Highest education level: Not on file  Occupational History  . Occupation: retired  Tobacco Use  . Smoking status: Never Smoker  . Smokeless tobacco: Never Used  Vaping Use  . Vaping Use: Never used  Substance and Sexual Activity  . Alcohol use: No  . Drug use: No  . Sexual activity: Not Currently  Other Topics Concern  . Not on file  Social History Narrative   Is a long term resident of [facility]    Social Determinants of Health   Financial Resource Strain: Not on file  Food Insecurity: Not on file  Transportation Needs: Not on file  Physical  Activity: Not on file  Stress: Not on file  Social Connections: Not on file  Intimate Partner Violence: Not on file   Family History  Problem Relation Age of Onset  . Cancer Other        family history   . Arthritis Other        family history   . Heart attack Mother 21  . Stroke Father 73  . Macular degeneration Sister   . 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 aspirin EC 325 MG EC tablet Take 1 tablet (325 mg total) by mouth daily.  11/07/2020 [place]-Castor Oil (VENELEX) OINT Apply to sacrum and bilateral buttocks qshift & prn for prevention.  . NON FORMULARY Diet type:  Regular  . Nutritional Supplements (ENSURE ENLIVE PO) Give 120 ml by mouth two times daily between meals  . senna-docusate (SENOKOT-S) 8.6-50 MG tablet Take 2 tablets by mouth 2 (two) times daily  as needed.   No facility-administered encounter medications on file as of 11/05/2020.     SIGNIFICANT DIAGNOSTIC EXAMS   LABS REVIEWED PREVIOUS:   12-24-19: wbc 7.9; hgb 13.0; hct 41.7; mcv 99.3 plt 221; glucose 106; bun 28; creat 1.40 ;k+ 4.0; na++ 138; ca 9.1 liver normal albumin 3.6  06-30-20: wbc 5.7; hgb 12.2; hct 38.3 mcv 97.2 plt 181; glucose 88; bun 19; creat 1.14; k+ 3.7; na++ 140; ca 8.6 liver normal albumin 3.2   NO NEW LABS.   Review of Systems  Unable to perform ROS: Dementia (unable to participate )   Physical Exam Constitutional:      General: She is not in acute distress.    Appearance: She is underweight and well-nourished. She is not diaphoretic.  Neck:     Thyroid: No thyromegaly.  Cardiovascular:     Rate and Rhythm: Normal rate and regular rhythm.     Pulses: Normal pulses and intact distal pulses.     Heart sounds: Murmur heard.       Comments: 3/6 Pulmonary:     Effort: Pulmonary effort is normal. No respiratory distress.     Breath sounds: Normal breath sounds.  Abdominal:     General: Bowel sounds are normal. There is no distension.     Palpations: Abdomen is soft.     Tenderness: There is no abdominal tenderness.  Musculoskeletal:        General: No edema. Normal range of motion.     Cervical back: Neck supple.     Right lower leg: No edema.     Left lower leg: No edema.  Lymphadenopathy:     Cervical: No cervical adenopathy.  Skin:    General: Skin is warm and dry.  Neurological:     Mental Status: She is alert. Mental status is at baseline.  Psychiatric:        Mood and Affect: Mood and affect and mood normal.       ASSESSMENT/ PLAN:  TODAY  1. Essential hypertension: is stable b/p 167/77 will monitor   2. 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 Depression major single episode: is stable is off zoloft.    PREVIOUS   4. Late onset alzheimer's disease without behavioral disturbance: is without significant change is losing weight current weight is 109 pounds will monitor   5. mixed hyperlipidemia  Is stable LDL 153 will monitor   6. Hypokalemia: is stable k+ 3.7 will monitor   7. Failure to thrive in adult is without change albumin 3.2 current weight is 109 pounds; will continue supplements as ordered; unfortunately weight loss is an unfortunate outcome in the the late stages of dementia.      MD is aware of resident's narcotic use and is in agreement with current plan of care. We will attempt to wean resident as appropriate.  Synthia Innocent NP [facility]  Contact [phone] Monday through Friday 8am- 5pm  After hours call [phone]

## 2020-12-01 ENCOUNTER — Encounter: Payer: Self-pay | Admitting: Adult Health

## 2020-12-01 ENCOUNTER — Non-Acute Institutional Stay (SKILLED_NURSING_FACILITY): Payer: Medicare PPO | Admitting: Adult Health

## 2020-12-01 DIAGNOSIS — F0280 Dementia in other diseases classified elsewhere without behavioral disturbance: Secondary | ICD-10-CM

## 2020-12-01 DIAGNOSIS — R627 Adult failure to thrive: Secondary | ICD-10-CM

## 2020-12-01 DIAGNOSIS — G301 Alzheimer's disease with late onset: Secondary | ICD-10-CM | POA: Diagnosis not present

## 2020-12-01 NOTE — Progress Notes (Signed)
Location:  [facility] Room Number: 142/D Place of Service:  SNF (31)   CODE STATUS: DNR  No Known Allergies  Chief Complaint  Patient presents with  . Acute Visit    Chang in Status    HPI:  Staff report that she is having a decline in her status. She is more resistant to care. Her oral intake is very poor. She continues to slowly lose weight. There are indications of of pain when she eats. She verbally declines pain. There are no reports of fever present. There are no reports of changes in urine.   Past Medical History:  Diagnosis Date  . Dementia without behavioral disturbance (HCC) 04/16/2016   mild with MMSE 24/30 as of 04/16/16  . Diverticulitis   . Hyperlipemia   . Hypertension   . Macular degeneration   . OA (osteoarthritis)     Past Surgical History:  Procedure Laterality Date  . APPENDECTOMY    . TONSILLECTOMY AND ADENOIDECTOMY      Social History   Socioeconomic History  . Marital status: Widowed    Spouse name: Not on file  . Number of children: Not on file  . Years of education: 13 yrs   . Highest education level: Not on file  Occupational History  . Occupation: retired  Tobacco Use  . Smoking status: Never Smoker  . Smokeless tobacco: Never Used  Vaping Use  . Vaping Use: Never used  Substance and Sexual Activity  . Alcohol use: No  . Drug use: No  . Sexual activity: Not Currently  Other Topics Concern  . Not on file  Social History Narrative   Is a long term resident of [facility]    Social Determinants of Health   Financial Resource Strain: Not on file  Food Insecurity: Not on file  Transportation Needs: Not on file  Physical Activity: Not on file  Stress: Not on file  Social Connections: Not on file  Intimate Partner Violence: Not on file   Family History  Problem Relation Age of Onset  . Cancer Other        family history   . Arthritis Other        family history   . Heart attack Mother 21  . Stroke Father 38   . Macular degeneration Sister   . 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 aspirin EC 325 MG EC tablet Take 1 tablet (325 mg total) by mouth daily.  12/03/2020 [place]-Castor Oil (VENELEX) OINT Apply to sacrum and bilateral buttocks qshift & prn for prevention.  . NON FORMULARY Diet type:  Regular  . Nutritional Supplements (ENSURE ENLIVE PO) Give 120 ml by mouth two times daily between meals  . senna-docusate (SENOKOT-S) 8.6-50 MG tablet Take 2 tablets by mouth 2 (two) times daily as needed.   No facility-administered encounter medications on file as of 12/01/2020.     SIGNIFICANT DIAGNOSTIC EXAMS   LABS REVIEWED PREVIOUS:   12-24-19: wbc 7.9; hgb 13.0; hct 41.7; mcv 99.3 plt 221; glucose 106; bun 28; creat 1.40 ;k+ 4.0; na++ 138; ca  9.1 liver normal albumin 3.6  06-30-20: wbc 5.7; hgb 12.2; hct 38.3 mcv 97.2 plt 181; glucose 88; bun 19; creat 1.14; k+ 3.7; na++ 140; ca 8.6 liver normal albumin 3.2   NO NEW LABS.   Review of Systems  Unable to perform ROS: Dementia (unable to participate )    Physical Exam Constitutional:      General: She is not in acute distress.    Appearance: She is cachectic. She is not diaphoretic.  HENT:     Mouth/Throat:     Comments: Declined oral exam  Neck:     Thyroid: No thyromegaly.  Cardiovascular:     Rate and Rhythm: Normal rate and regular rhythm.     Pulses: Normal pulses.     Heart sounds: Murmur heard.      Comments: 3/6 Pulmonary:     Effort: Pulmonary effort is normal. No respiratory distress.     Breath sounds: Normal breath sounds.  Abdominal:     General: Bowel sounds are normal. There is no distension.     Palpations: Abdomen is soft.     Tenderness: There is no abdominal  tenderness.  Musculoskeletal:        General: Normal range of motion.     Cervical back: Neck supple.     Right lower leg: No edema.     Left lower leg: No edema.  Lymphadenopathy:     Cervical: No cervical adenopathy.  Skin:    General: Skin is warm and dry.  Neurological:     Mental Status: She is alert and oriented to person, place, and time.  Psychiatric:        Mood and Affect: Mood normal.      ASSESSMENT/ PLAN:  TODAY  1. Failure to thrive in adult 2. Late onset alzheimer's disease without behavioral disturbance.   Will setup a family care plan meeting to discuss her overall status and to reestablish goals of care.    Synthia Innocent NP [facility]  Contact [phone] Monday through Friday 8am- 5pm  After hours call [phone]

## 2020-12-02 ENCOUNTER — Non-Acute Institutional Stay (SKILLED_NURSING_FACILITY): Payer: Medicare PPO | Admitting: Adult Health

## 2020-12-02 DIAGNOSIS — F0280 Dementia in other diseases classified elsewhere without behavioral disturbance: Secondary | ICD-10-CM

## 2020-12-02 DIAGNOSIS — G301 Alzheimer's disease with late onset: Secondary | ICD-10-CM

## 2020-12-02 DIAGNOSIS — M2042 Other hammer toe(s) (acquired), left foot: Secondary | ICD-10-CM | POA: Diagnosis not present

## 2020-12-02 DIAGNOSIS — M2041 Other hammer toe(s) (acquired), right foot: Secondary | ICD-10-CM | POA: Diagnosis not present

## 2020-12-02 DIAGNOSIS — B351 Tinea unguium: Secondary | ICD-10-CM | POA: Diagnosis not present

## 2020-12-02 DIAGNOSIS — R627 Adult failure to thrive: Secondary | ICD-10-CM | POA: Diagnosis not present

## 2020-12-02 DIAGNOSIS — I739 Peripheral vascular disease, unspecified: Secondary | ICD-10-CM | POA: Diagnosis not present

## 2020-12-05 ENCOUNTER — Non-Acute Institutional Stay (SKILLED_NURSING_FACILITY): Payer: Medicare PPO | Admitting: Adult Health

## 2020-12-05 ENCOUNTER — Encounter: Payer: Self-pay | Admitting: Adult Health

## 2020-12-05 DIAGNOSIS — E782 Mixed hyperlipidemia: Secondary | ICD-10-CM | POA: Diagnosis not present

## 2020-12-05 DIAGNOSIS — F0280 Dementia in other diseases classified elsewhere without behavioral disturbance: Secondary | ICD-10-CM

## 2020-12-05 DIAGNOSIS — E876 Hypokalemia: Secondary | ICD-10-CM

## 2020-12-05 DIAGNOSIS — G301 Alzheimer's disease with late onset: Secondary | ICD-10-CM

## 2020-12-05 NOTE — Progress Notes (Signed)
Location:  [facility] Room Number: 222 Place of Service:  SNF (31)   CODE STATUS: dnr  No Known Allergies  Chief Complaint  Patient presents with  . Acute Visit    Care plan meeting     HPI:  We have come together for her care plan meeting. Her family is present. She is continuing to decline in her overall status. Her appetite remains very poor. She will eat better with her family; however she is not eating enough to sustain life. We have discussed her future care. We have discussed hospitalizations: the only time she will go to the hospital is for major fractures. They do not want a feeding tube. They would like for infections to be treat as able.   Past Medical History:  Diagnosis Date  . Dementia without behavioral disturbance (HCC) 04/16/2016   mild with MMSE 24/30 as of 04/16/16  . Diverticulitis   . Hyperlipemia   . Hypertension   . Macular degeneration   . OA (osteoarthritis)     Past Surgical History:  Procedure Laterality Date  . APPENDECTOMY    . TONSILLECTOMY AND ADENOIDECTOMY      Social History   Socioeconomic History  . Marital status: Widowed    Spouse name: Not on file  . Number of children: Not on file  . Years of education: 13 yrs   . Highest education level: Not on file  Occupational History  . Occupation: retired  Tobacco Use  . Smoking status: Never Smoker  . Smokeless tobacco: Never Used  Vaping Use  . Vaping Use: Never used  Substance and Sexual Activity  . Alcohol use: No  . Drug use: No  . Sexual activity: Not Currently  Other Topics Concern  . Not on file  Social History Narrative   Is a long term resident of [facility]    Social Determinants of Health   Financial Resource Strain: Not on file  Food Insecurity: Not on file  Transportation Needs: Not on file  Physical Activity: Not on file  Stress: Not on file  Social Connections: Not on file  Intimate Partner Violence: Not on file   Family History  Problem  Relation Age of Onset  . Cancer Other        family history   . Arthritis Other        family history   . Heart attack Mother 34  . Stroke Father 40  . Macular degeneration Sister   . 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 aspirin EC 325 MG EC tablet Take 1 tablet (325 mg total) by mouth daily.  12/04/2020 [place]-Castor Oil (VENELEX) OINT Apply to sacrum and bilateral buttocks qshift & prn for prevention.  . NON FORMULARY Diet type:  Regular  . Nutritional Supplements (ENSURE ENLIVE PO) Give 120 ml by mouth two times daily between meals  . senna-docusate (SENOKOT-S) 8.6-50 MG tablet Take 2 tablets by mouth 2 (two) times daily as needed.   No facility-administered encounter medications on file as of 12/02/2020.     SIGNIFICANT DIAGNOSTIC EXAMS   LABS  REVIEWED PREVIOUS:   12-24-19: wbc 7.9; hgb 13.0; hct 41.7; mcv 99.3 plt 221; glucose 106; bun 28; creat 1.40 ;k+ 4.0; na++ 138; ca 9.1 liver normal albumin 3.6  06-30-20: wbc 5.7; hgb 12.2; hct 38.3 mcv 97.2 plt 181; glucose 88; bun 19; creat 1.14; k+ 3.7; na++ 140; ca 8.6 liver normal albumin 3.2   NO NEW LABS.   Review of Systems  Unable to perform ROS: Dementia (is unable to participate )    Physical Exam Constitutional:      General: She is not in acute distress.    Appearance: She is cachectic. She is not diaphoretic.  Neck:     Thyroid: No thyromegaly.  Cardiovascular:     Rate and Rhythm: Normal rate and regular rhythm.     Pulses: Normal pulses.     Heart sounds: Murmur heard.      Comments: 3/6 Pulmonary:     Effort: Pulmonary effort is normal. No respiratory distress.     Breath sounds: Normal breath sounds.  Abdominal:     General: Bowel sounds are normal. There is no  distension.     Palpations: Abdomen is soft.     Tenderness: There is no abdominal tenderness.  Musculoskeletal:        General: Normal range of motion.     Cervical back: Neck supple.  Lymphadenopathy:     Cervical: No cervical adenopathy.  Skin:    General: Skin is warm and dry.  Neurological:     Mental Status: She is alert. Mental status is at baseline.  Psychiatric:        Mood and Affect: Mood normal.      ASSESSMENT/ PLAN:  TODAY  1. Failure to thrive in adult 2. Late onset alzheimer's disease  No hospitalizations except for major fractures No feeding tube Will treat infections as they occur  Time spent with patient and family 45 minutes >50% spent with counseling regarding her end of life issues; her family will consider hospice care.    Synthia Innocent NP [facility]  Contact [phone] Monday through Friday 8am- 5pm  After hours call [phone]

## 2020-12-05 NOTE — Progress Notes (Signed)
Location:  [facility] Room Number: 222 Place of Service:  SNF (31)   CODE STATUS: dnr  No Known Allergies  Chief Complaint  Patient presents with  . Medical Management of Chronic Issues            Late onset alzheimer's disease without behavioral disturbance:  Mixed hyperlipidemia Hypokalemia    HPI:  She is a 87 year old long term resident of this facility being seen for the management of her chronic illnesses: Late onset alzheimer's disease without behavioral disturbance:  Mixed hyperlipidemia Hypokalemia. She will be resistant to nursing care at times. Her appetite remains poor and her fluid intake is poor as well   Past Medical History:  Diagnosis Date  . Dementia without behavioral disturbance (HCC) 04/16/2016   mild with MMSE 24/30 as of 04/16/16  . Diverticulitis   . Hyperlipemia   . Hypertension   . Macular degeneration   . OA (osteoarthritis)     Past Surgical History:  Procedure Laterality Date  . APPENDECTOMY    . TONSILLECTOMY AND ADENOIDECTOMY      Social History   Socioeconomic History  . Marital status: Widowed    Spouse name: Not on file  . Number of children: Not on file  . Years of education: 13 yrs   . Highest education level: Not on file  Occupational History  . Occupation: retired  Tobacco Use  . Smoking status: Never Smoker  . Smokeless tobacco: Never Used  Vaping Use  . Vaping Use: Never used  Substance and Sexual Activity  . Alcohol use: No  . Drug use: No  . Sexual activity: Not Currently  Other Topics Concern  . Not on file  Social History Narrative   Is a long term resident of [facility]    Social Determinants of Health   Financial Resource Strain: Not on file  Food Insecurity: Not on file  Transportation Needs: Not on file  Physical Activity: Not on file  Stress: Not on file  Social Connections: Not on file  Intimate Partner Violence: Not on file   Family History  Problem Relation Age of Onset  . Cancer  Other        family history   . Arthritis Other        family history   . Heart attack Mother 69  . Stroke Father 30  . Macular degeneration Sister   . 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 aspirin EC 325 MG EC tablet Take 1 tablet (325 mg total) by mouth daily.  12/07/2020 [place]-Castor Oil (VENELEX) OINT Apply to sacrum and bilateral buttocks qshift & prn for prevention.  . NON FORMULARY Diet type:  Regular  . Nutritional Supplements (ENSURE ENLIVE PO) Give 120 ml by mouth two times daily between meals  . senna-docusate (SENOKOT-S) 8.6-50 MG tablet Take 2 tablets by mouth 2 (two) times daily as needed.   No facility-administered encounter medications on file as of 12/05/2020.     SIGNIFICANT DIAGNOSTIC EXAMS   LABS REVIEWED PREVIOUS:   12-24-19: wbc 7.9; hgb 13.0; hct 41.7; mcv 99.3 plt 221;  glucose 106; bun 28; creat 1.40 ;k+ 4.0; na++ 138; ca 9.1 liver normal albumin 3.6  06-30-20: wbc 5.7; hgb 12.2; hct 38.3 mcv 97.2 plt 181; glucose 88; bun 19; creat 1.14; k+ 3.7; na++ 140; ca 8.6 liver normal albumin 3.2   NO NEW LABS.   Review of Systems  Unable to perform ROS: Dementia (unable to participate )   Physical Exam Constitutional:      General: She is not in acute distress.    Appearance: She is cachectic. She is not diaphoretic.  Neck:     Thyroid: No thyromegaly.  Cardiovascular:     Rate and Rhythm: Normal rate and regular rhythm.     Pulses: Normal pulses.     Heart sounds: Murmur heard.      Comments: 3/6 Pulmonary:     Effort: Pulmonary effort is normal. No respiratory distress.     Breath sounds: Normal breath sounds.  Abdominal:     General: Bowel sounds are normal. There is no distension.     Palpations: Abdomen is soft.      Tenderness: There is no abdominal tenderness.  Musculoskeletal:        General: Normal range of motion.     Cervical back: Neck supple.     Right lower leg: No edema.     Left lower leg: No edema.  Lymphadenopathy:     Cervical: No cervical adenopathy.  Skin:    General: Skin is warm and dry.  Neurological:     Mental Status: She is alert. Mental status is at baseline.  Psychiatric:        Mood and Affect: Mood normal.       ASSESSMENT/ PLAN:  TODAY  1. Late onset alzheimer's disease without behavioral disturbance: she continues to lose weight. Her family has decided upon no tube feeding; her weight is 105 pounds will monitor  2. Mixed hyperlipidemia is stable LDL 153 will monitor   3. Hypokalemia k+ 3.7 will monitor    PREVIOUS    4. Failure to thrive in adult is without change albumin 3.2 current weight is 105 pounds; will continue supplements as ordered; unfortunately weight loss is an unfortunate outcome in the the late stages of dementia.   5 Essential hypertension: is stable b/p 108/56 will monitor   6. Cerebrovascular accident (CVA) due to bilateral embolism of middle cerebral arteries: is stable will continue asa 325 mg daily   7. Depression major single episode: is stable is off zoloft.      Synthia Innocent NP [facility]  Contact [phone] Monday through Friday 8am- 5pm  After hours call [phone]

## 2020-12-07 ENCOUNTER — Telehealth: Payer: Self-pay | Admitting: Internal Medicine

## 2020-12-07 NOTE — Telephone Encounter (Signed)
Patient is not doing well. SOB Tachycardic. Not taking anything PO. More lethargic Family wants no hospitalization. Wants her to be comfortable Started on Roxanol 5 mg Q 4 PRN and Ativan 0.5 mg Q 4 hours pRN for [facility] consult

## 2020-12-08 ENCOUNTER — Other Ambulatory Visit: Payer: Self-pay | Admitting: Adult Health

## 2020-12-08 ENCOUNTER — Encounter: Payer: Self-pay | Admitting: Adult Health

## 2020-12-08 ENCOUNTER — Non-Acute Institutional Stay (SKILLED_NURSING_FACILITY): Payer: Medicare PPO | Admitting: Adult Health

## 2020-12-08 DIAGNOSIS — G301 Alzheimer's disease with late onset: Secondary | ICD-10-CM

## 2020-12-08 DIAGNOSIS — F0280 Dementia in other diseases classified elsewhere without behavioral disturbance: Secondary | ICD-10-CM

## 2020-12-08 DIAGNOSIS — R627 Adult failure to thrive: Secondary | ICD-10-CM | POA: Diagnosis not present

## 2020-12-08 MED ORDER — MORPHINE SULFATE (CONCENTRATE) 20 MG/ML PO SOLN: 5.0000 mg | ORAL | 0 refills | Status: AC

## 2020-12-08 MED ORDER — LORAZEPAM 0.5 MG PO TABS: 0.5000 mg | ORAL_TABLET | ORAL | 0 refills | Status: AC | PRN

## 2020-12-08 NOTE — Progress Notes (Signed)
Location:  [facility] Room Number: 222 Place of Service:  SNF (31)   CODE STATUS: dnr  No Known Allergies  Chief Complaint  Patient presents with  . Acute Visit    End of life     HPI:  She is dying with less that 48-72 hours left of her life. She is not responsive to verbal stimuli. Her family is present at bedside. Her extremities are cold but not cyanotic at this time.  She is have short periods of time of apnea present and has cheyne stokes respirations present. There are no indications of distress present. She does have blowing respirations periodically . Her family is concerned that her distress is not being adequately managed. She is presently taking roxanol 5 mg every 2 hours as needed.   Past Medical History:  Diagnosis Date  . Dementia without behavioral disturbance (HCC) 04/16/2016   mild with MMSE 24/30 as of 04/16/16  . Diverticulitis   . Hyperlipemia   . Hypertension   . Macular degeneration   . OA (osteoarthritis)     Past Surgical History:  Procedure Laterality Date  . APPENDECTOMY    . TONSILLECTOMY AND ADENOIDECTOMY      Social History   Socioeconomic History  . Marital status: Widowed    Spouse name: Not on file  . Number of children: Not on file  . Years of education: 13 yrs   . Highest education level: Not on file  Occupational History  . Occupation: retired  Tobacco Use  . Smoking status: Never Smoker  . Smokeless tobacco: Never Used  Vaping Use  . Vaping Use: Never used  Substance and Sexual Activity  . Alcohol use: No  . Drug use: No  . Sexual activity: Not Currently  Other Topics Concern  . Not on file  Social History Narrative   Is a long term resident of [facility]    Social Determinants of Health   Financial Resource Strain: Not on file  Food Insecurity: Not on file  Transportation Needs: Not on file  Physical Activity: Not on file  Stress: Not on file  Social Connections: Not on file  Intimate Partner  Violence: Not on file   Family History  Problem Relation Age of Onset  . Cancer Other        family history   . Arthritis Other        family history   . Heart attack Mother 4  . Stroke Father 61  . Macular degeneration Sister   . 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 aspirin EC 325 MG EC tablet Take 1 tablet (325 mg total) by mouth daily.  Lucilla Lame [place]-Castor Oil (VENELEX) OINT Apply to sacrum and bilateral buttocks qshift & prn for prevention.  Marland Kitchen LORazepam (ATIVAN) 0.5 MG tablet Take 1 tablet (0.5 mg total) by mouth every 4 (four) hours as needed for anxiety.  Marland Kitchen morphine (ROXANOL) 20 MG/ML concentrated solution Take 0.25 mLs (5 mg total) by mouth every 2 (two) hours. AND every hour as needed  . NON FORMULARY Diet  type:  Regular  . Nutritional Supplements (ENSURE ENLIVE PO) Give 120 ml by mouth two times daily between meals  . senna-docusate (SENOKOT-S) 8.6-50 MG tablet Take 2 tablets by mouth 2 (two) times daily as needed.   No facility-administered encounter medications on file as of 12/08/2020.     SIGNIFICANT DIAGNOSTIC EXAMS   LABS REVIEWED PREVIOUS:   12-24-19: wbc 7.9; hgb 13.0; hct 41.7; mcv 99.3 plt 221; glucose 106; bun 28; creat 1.40 ;k+ 4.0; na++ 138; ca 9.1 liver normal albumin 3.6  06-30-20: wbc 5.7; hgb 12.2; hct 38.3 mcv 97.2 plt 181; glucose 88; bun 19; creat 1.14; k+ 3.7; na++ 140; ca 8.6 liver normal albumin 3.2   NO NEW LABS.   Review of Systems  Unable to perform ROS: Medical condition (is not respsonive to verbal stimuli )    Physical Exam Constitutional:      General: She is not in acute distress.    Appearance: She is cachectic. She is not diaphoretic.  Neck:     Thyroid: No thyromegaly.   Cardiovascular:     Rate and Rhythm: Normal rate and regular rhythm.     Pulses: Normal pulses.     Heart sounds: Normal heart sounds.  Pulmonary:     Effort: No respiratory distress.     Comments: Has apnea Cheyne stokes Abdominal:     General: Bowel sounds are normal. There is no distension.     Palpations: Abdomen is soft.     Tenderness: There is no abdominal tenderness.  Musculoskeletal:     Cervical back: Neck supple.     Right lower leg: No edema.     Left lower leg: No edema.  Lymphadenopathy:     Cervical: No cervical adenopathy.  Skin:    General: Skin is dry.     Comments: Cold extremities   Neurological:     Comments: Not aware        ASSESSMENT/ PLAN:  TODAY  1. Late onset alzheimer's disease without behavioral disturbance 2. Failure to thrive in adult  Will change roxanol to 5 mg every 2 hours and every 1 hour as needed Will continue ativan 0.5 mg every 4 hours as needed Will continue to focus upon her comfort.   Time spent with patient and family 40 minutes: coordination of care and counseling to include life expectancy; what to expect over the coming hours to days. Medications needed; verbalized understanding.   Synthia Innocent NP [facility]  Contact [phone] Monday through Friday 8am- 5pm  After hours call [phone]

## 2020-12-12 DEATH — deceased
